# Patient Record
Sex: Male | Born: 1980 | Race: Black or African American | Hispanic: No | Marital: Single | Smoking: Former smoker
Health system: Southern US, Community
[De-identification: ages and names within clinical notes are randomized; demographics above are authoritative.]

## PROBLEM LIST (undated history)

## (undated) DIAGNOSIS — Z789 Other specified health status: Secondary | ICD-10-CM

---
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
## 2006-08-24 ENCOUNTER — Emergency Department (HOSPITAL_COMMUNITY): Admission: EM | Admit: 2006-08-24 | Discharge: 2006-08-24 | Payer: Self-pay

## 2006-08-26 ENCOUNTER — Emergency Department (HOSPITAL_COMMUNITY): Admission: EM | Admit: 2006-08-26 | Discharge: 2006-08-26 | Payer: Self-pay | Admitting: Emergency Medicine

## 2006-08-28 ENCOUNTER — Emergency Department (HOSPITAL_COMMUNITY): Admission: EM | Admit: 2006-08-28 | Discharge: 2006-08-29 | Payer: Self-pay | Admitting: *Deleted

## 2007-04-08 ENCOUNTER — Emergency Department (HOSPITAL_COMMUNITY): Admission: EM | Admit: 2007-04-08 | Discharge: 2007-04-08 | Payer: Self-pay | Admitting: Emergency Medicine
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
## 2008-08-18 ENCOUNTER — Emergency Department (HOSPITAL_COMMUNITY): Admission: EM | Admit: 2008-08-18 | Discharge: 2008-08-18 | Payer: Self-pay | Admitting: Emergency Medicine

## 2008-08-23 ENCOUNTER — Emergency Department (HOSPITAL_COMMUNITY): Admission: EM | Admit: 2008-08-23 | Discharge: 2008-08-24 | Payer: Self-pay | Admitting: Emergency Medicine

## 2008-10-26 ENCOUNTER — Emergency Department (HOSPITAL_COMMUNITY): Admission: EM | Admit: 2008-10-26 | Discharge: 2008-10-27 | Payer: Self-pay | Admitting: Emergency Medicine

## 2009-05-16 ENCOUNTER — Emergency Department (HOSPITAL_COMMUNITY): Admission: EM | Admit: 2009-05-16 | Discharge: 2009-05-16 | Payer: Self-pay | Admitting: Emergency Medicine

## 2010-04-25 ENCOUNTER — Emergency Department (HOSPITAL_COMMUNITY)
Admission: EM | Admit: 2010-04-25 | Discharge: 2010-04-25 | Payer: Self-pay | Source: Home / Self Care | Admitting: Emergency Medicine

## 2010-08-27 ENCOUNTER — Emergency Department (HOSPITAL_COMMUNITY)
Admission: EM | Admit: 2010-08-27 | Discharge: 2010-08-28 | Disposition: A | Discharge status: Home / Self Care | Payer: Self-pay | Attending: Emergency Medicine | Admitting: Emergency Medicine

## 2010-08-27 ENCOUNTER — Emergency Department (HOSPITAL_COMMUNITY): Payer: Self-pay

## 2010-08-27 DIAGNOSIS — M25569 Pain in unspecified knee: Secondary | ICD-10-CM | POA: Insufficient documentation

## 2011-11-15 ENCOUNTER — Emergency Department (HOSPITAL_COMMUNITY)
Admission: EM | Admit: 2011-11-15 | Discharge: 2011-11-15 | Disposition: A | Discharge status: Home / Self Care | Payer: Self-pay | Attending: Emergency Medicine | Admitting: Emergency Medicine

## 2011-11-15 ENCOUNTER — Encounter (HOSPITAL_COMMUNITY): Payer: Self-pay | Admitting: Emergency Medicine

## 2011-11-15 DIAGNOSIS — M272 Inflammatory conditions of jaws: Secondary | ICD-10-CM | POA: Insufficient documentation
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
## 2013-08-17 ENCOUNTER — Encounter: Payer: Self-pay | Admitting: Medical

## 2014-05-04 ENCOUNTER — Emergency Department (HOSPITAL_COMMUNITY)
Admission: EM | Admit: 2014-05-04 | Discharge: 2014-05-04 | Disposition: A | Discharge status: Home / Self Care | Payer: 59 | Attending: Emergency Medicine | Admitting: Emergency Medicine

## 2014-05-04 ENCOUNTER — Encounter (HOSPITAL_COMMUNITY): Payer: Self-pay | Admitting: Emergency Medicine

## 2014-05-04 DIAGNOSIS — Z720 Tobacco use: Secondary | ICD-10-CM | POA: Diagnosis not present

## 2014-05-04 DIAGNOSIS — K088 Other specified disorders of teeth and supporting structures: Secondary | ICD-10-CM | POA: Insufficient documentation

## 2014-05-04 DIAGNOSIS — K0889 Other specified disorders of teeth and supporting structures: Secondary | ICD-10-CM

## 2014-05-04 MED ORDER — PENICILLIN V POTASSIUM 500 MG PO TABS: 500.0000 mg | ORAL_TABLET | Freq: Three times a day (TID) | ORAL | Status: DC

## 2014-05-04 MED ORDER — HYDROCODONE-ACETAMINOPHEN 5-325 MG PO TABS: 1.0000 | ORAL_TABLET | ORAL | Status: DC | PRN

## 2014-05-04 MED ORDER — HYDROCODONE-ACETAMINOPHEN 5-325 MG PO TABS
2.0000 | ORAL_TABLET | Freq: Once | ORAL | Status: AC
  Administered 2014-05-04: 2 via ORAL
  Filled 2014-05-04: qty 2

## 2014-05-04 MED ORDER — PENICILLIN V POTASSIUM 500 MG PO TABS
500.0000 mg | ORAL_TABLET | Freq: Once | ORAL | Status: AC
  Administered 2014-05-04: 500 mg via ORAL
  Filled 2014-05-04: qty 1

## 2014-05-04 NOTE — ED Notes (Signed)
Pt was advised to follow up with dentist.

## 2014-05-04 NOTE — Discharge Instructions (Signed)

## 2014-05-04 NOTE — ED Notes (Signed)
Pt states that he has had tooth pain x 3 days. Pt denies any trauma or injury to the site. Pt states he has not seen a dentist since 2005 and does not have a regular dentist.

## 2014-05-04 NOTE — ED Provider Notes (Signed)
CSN: [id number]     Arrival date & time 05/04/14  2054 History   None    Chief Complaint  Patient presents with  . Dental Pain   Patient is a 33 y.o. male presenting with tooth pain. The history is provided by the patient. No language interpreter was used.  Dental Pain Associated symptoms: no facial swelling and no fever    This chart was scribed for non-physician practitioner Trixie DredgeEmily West, PA-C,  working with No att. providers found, by Andrew Auaven Small, ED Scribe. This patient was seen in room WTR9/WTR9 and the patient's care was started at 9:30 PM.  Lynder Parentsroy M Cutrona is a 33 y.o. male who presents to the Emergency Department complaining of left dental pain that began 3 days ago. Pt denies broken tooth. He reports worsening pain with eating. He reports an upcoming appointment with dentist next week. Pt denies fever and facial swelling. Pt is a smoker. Pt is otherwise healthy.  History reviewed. No pertinent past medical history. History reviewed. No pertinent past surgical history. No family history on file. History  Substance Use Topics  . Smoking status: Current Every Day Smoker  . Smokeless tobacco: Not on file  . Alcohol Use: Yes    Review of Systems  Constitutional: Negative for fever.  HENT: Positive for dental problem. Negative for facial swelling and trouble swallowing.   Respiratory: Negative for shortness of breath.   Gastrointestinal: Negative for nausea.  Musculoskeletal: Negative for myalgias.    Allergies  Percocet  Home Medications   Prior to Admission medications   Medication Sig Start Date End Date Taking? Authorizing Provider  Aspirin-Salicylamide-Caffeine (BC HEADACHE POWDER PO) Take 1 each by mouth as needed (pain).   Yes Historical Provider, MD  ibuprofen (ADVIL,MOTRIN) 200 MG tablet Take 400 mg by mouth every 6 (six) hours as needed for moderate pain.   Yes Historical Provider, MD   BP 129/79 mmHg  Pulse 57  Temp(Src) 98.3 F (36.8 C) (Oral)  Resp 18  Ht 6'  (1.829 m)  Wt 185 lb (83.915 kg)  BMI 25.08 kg/m2  SpO2 99% Physical Exam  Constitutional: He is oriented to person, place, and time. He appears well-developed and well-nourished. No distress.  HENT:  Head: Normocephalic and atraumatic.  Good dentition. Multiple molar fillings. No visualized abscess. Tender to alveolar ridge adjacent to number 19  Eyes: Conjunctivae and EOM are normal.  Neck: Neck supple.  Cardiovascular: Normal rate.   Pulmonary/Chest: Effort normal.  Musculoskeletal: Normal range of motion.  Lymphadenopathy:    He has no cervical adenopathy.  Neurological: He is alert and oriented to person, place, and time.  Skin: Skin is warm and dry.  Psychiatric: He has a normal mood and affect. His behavior is normal.  Nursing note and vitals reviewed.   ED Course  Procedures (including critical care time) DIAGNOSTIC STUDIES: Oxygen Saturation is 99% on RA, normal by my interpretation.    COORDINATION OF CARE: 9:30 PM- Pt advised of plan for treatment and pt agrees.  Labs Review Labs Reviewed - No data to display  Imaging Review No results found.   EKG Interpretation None      MDM   Final diagnoses:  None    1. Dental pain  He has dental follow up in place. Will provide abx and pain control for uncomplicated dental pain.  I personally performed the services described in this documentation, which was scribed in my presence. The recorded information has been reviewed and is accurate.  Arnoldo HookerShari A Yoseph Haile, PA-C 05/04/14 2317  Arby BarretteMarcy Pfeiffer, MD 05/05/14 [phone]

## 2016-12-16 ENCOUNTER — Encounter (HOSPITAL_COMMUNITY): Payer: Self-pay | Admitting: Emergency Medicine

## 2016-12-16 ENCOUNTER — Emergency Department (HOSPITAL_COMMUNITY)
Admission: EM | Admit: 2016-12-16 | Discharge: 2016-12-16 | Disposition: A | Discharge status: Home / Self Care | Payer: Self-pay | Attending: Emergency Medicine | Admitting: Emergency Medicine

## 2016-12-16 ENCOUNTER — Encounter (HOSPITAL_COMMUNITY): Payer: Self-pay

## 2016-12-16 ENCOUNTER — Encounter (HOSPITAL_COMMUNITY): Payer: Self-pay | Admitting: *Deleted

## 2016-12-16 ENCOUNTER — Emergency Department (HOSPITAL_COMMUNITY): Payer: Self-pay

## 2016-12-16 ENCOUNTER — Inpatient Hospital Stay (HOSPITAL_COMMUNITY)
Admission: EM | Admit: 2016-12-16 | Discharge: 2016-12-21 | DRG: 076 | Discharge status: Against Medical Advice | Payer: Self-pay | Attending: Internal Medicine | Admitting: Internal Medicine

## 2016-12-16 ENCOUNTER — Ambulatory Visit (HOSPITAL_COMMUNITY)
Admission: EM | Admit: 2016-12-16 | Discharge: 2016-12-16 | Disposition: A | Discharge status: Other Acute Inpatient Hospital | Payer: Self-pay

## 2016-12-16 DIAGNOSIS — R519 Headache, unspecified: Secondary | ICD-10-CM

## 2016-12-16 DIAGNOSIS — R291 Meningismus: Secondary | ICD-10-CM

## 2016-12-16 DIAGNOSIS — F17200 Nicotine dependence, unspecified, uncomplicated: Secondary | ICD-10-CM | POA: Diagnosis present

## 2016-12-16 DIAGNOSIS — B029 Zoster without complications: Secondary | ICD-10-CM

## 2016-12-16 DIAGNOSIS — Z5321 Procedure and treatment not carried out due to patient leaving prior to being seen by health care provider: Secondary | ICD-10-CM | POA: Insufficient documentation

## 2016-12-16 DIAGNOSIS — G039 Meningitis, unspecified: Secondary | ICD-10-CM

## 2016-12-16 DIAGNOSIS — B027 Disseminated zoster: Secondary | ICD-10-CM

## 2016-12-16 DIAGNOSIS — R1030 Lower abdominal pain, unspecified: Secondary | ICD-10-CM | POA: Insufficient documentation

## 2016-12-16 DIAGNOSIS — Z886 Allergy status to analgesic agent status: Secondary | ICD-10-CM

## 2016-12-16 DIAGNOSIS — R51 Headache: Secondary | ICD-10-CM

## 2016-12-16 DIAGNOSIS — B021 Zoster meningitis: Principal | ICD-10-CM | POA: Diagnosis present

## 2016-12-16 HISTORY — DX: Other specified health status: Z78.9

## 2016-12-16 LAB — CBC WITH DIFFERENTIAL/PLATELET
Basophils Absolute: 0 10*3/uL (ref 0.0–0.1)
Basophils Relative: 0 %
EOS ABS: 0 10*3/uL (ref 0.0–0.7)
EOS PCT: 0 %
HCT: 42.4 % (ref 39.0–52.0)
Hemoglobin: 14.7 g/dL (ref 13.0–17.0)
LYMPHS PCT: 8 %
Lymphs Abs: 0.9 10*3/uL (ref 0.7–4.0)
MCH: 29.4 pg (ref 26.0–34.0)
MCHC: 34.7 g/dL (ref 30.0–36.0)
MCV: 84.8 fL (ref 78.0–100.0)
Monocytes Absolute: 0.5 10*3/uL (ref 0.1–1.0)
Monocytes Relative: 5 %
NEUTROS ABS: 9.9 10*3/uL — AB (ref 1.7–7.7)
NEUTROS PCT: 87 %
PLATELETS: 192 10*3/uL (ref 150–400)
RBC: 5 MIL/uL (ref 4.22–5.81)
RDW: 12.3 % (ref 11.5–15.5)
WBC: 11.2 10*3/uL — ABNORMAL HIGH (ref 4.0–10.5)

## 2016-12-16 LAB — CSF CELL COUNT WITH DIFFERENTIAL
Eosinophils, CSF: 0 % (ref 0–1)
Lymphs, CSF: 98 % — ABNORMAL HIGH (ref 40–80)
MONOCYTE-MACROPHAGE-SPINAL FLUID: 2 % — AB (ref 15–45)
OTHER CELLS CSF: 0
RBC Count, CSF: 1 /mm3 — ABNORMAL HIGH
Segmented Neutrophils-CSF: 0 % (ref 0–6)
TUBE #: 3
WBC, CSF: 605 /mm3 (ref 0–5)

## 2016-12-16 LAB — COMPREHENSIVE METABOLIC PANEL
ALT: 16 U/L — ABNORMAL LOW (ref 17–63)
ANION GAP: 6 (ref 5–15)
AST: 19 U/L (ref 15–41)
Albumin: 3.7 g/dL (ref 3.5–5.0)
Alkaline Phosphatase: 34 U/L — ABNORMAL LOW (ref 38–126)
BUN: 6 mg/dL (ref 6–20)
CHLORIDE: 105 mmol/L (ref 101–111)
CO2: 27 mmol/L (ref 22–32)
Calcium: 8.8 mg/dL — ABNORMAL LOW (ref 8.9–10.3)
Creatinine, Ser: 1.18 mg/dL (ref 0.61–1.24)
GFR calc non Af Amer: >60 mL/min (ref >60)
Glucose, Bld: 100 mg/dL — ABNORMAL HIGH (ref 65–99)
POTASSIUM: 4 mmol/L (ref 3.5–5.1)
SODIUM: 138 mmol/L (ref 135–145)
Total Bilirubin: 1 mg/dL (ref 0.3–1.2)
Total Protein: 5.8 g/dL — ABNORMAL LOW (ref 6.5–8.1)

## 2016-12-16 LAB — PROTEIN, CSF: TOTAL PROTEIN, CSF: 92 mg/dL — AB (ref 15–45)

## 2016-12-16 LAB — GLUCOSE, CSF: Glucose, CSF: 52 mg/dL (ref 40–70)

## 2016-12-16 LAB — LIPASE, BLOOD: LIPASE: 23 U/L (ref 11–51)

## 2016-12-16 MED ORDER — ONDANSETRON HCL 4 MG PO TABS
4.0000 mg | ORAL_TABLET | Freq: Four times a day (QID) | ORAL | Status: DC | PRN
  Administered 2016-12-19: 4 mg via ORAL
  Filled 2016-12-16: qty 1

## 2016-12-16 MED ORDER — SODIUM CHLORIDE 0.9 % IV SOLN
INTRAVENOUS | Status: AC
  Administered 2016-12-16: 100 mL/h via INTRAVENOUS

## 2016-12-16 MED ORDER — DEXTROSE 5 % IV SOLN
900.0000 mg | Freq: Three times a day (TID) | INTRAVENOUS | Status: DC
  Administered 2016-12-17 – 2016-12-21 (×14): 900 mg via INTRAVENOUS
  Filled 2016-12-16 (×17): qty 18

## 2016-12-16 MED ORDER — SENNOSIDES-DOCUSATE SODIUM 8.6-50 MG PO TABS
1.0000 | ORAL_TABLET | Freq: Every evening | ORAL | Status: DC | PRN
  Filled 2016-12-16: qty 1

## 2016-12-16 MED ORDER — ACETAMINOPHEN 650 MG RE SUPP: 650.0000 mg | Freq: Four times a day (QID) | RECTAL | Status: DC | PRN

## 2016-12-16 MED ORDER — ACETAMINOPHEN 325 MG PO TABS
650.0000 mg | ORAL_TABLET | Freq: Four times a day (QID) | ORAL | Status: DC | PRN
  Administered 2016-12-17: 650 mg via ORAL
  Filled 2016-12-16: qty 2

## 2016-12-16 MED ORDER — DEXTROSE 5 % IV SOLN
2.0000 g | Freq: Two times a day (BID) | INTRAVENOUS | Status: DC
  Administered 2016-12-17 (×2): 2 g via INTRAVENOUS
  Filled 2016-12-16 (×3): qty 2

## 2016-12-16 MED ORDER — VANCOMYCIN HCL IN DEXTROSE 1-5 GM/200ML-% IV SOLN
1000.0000 mg | Freq: Once | INTRAVENOUS | Status: AC
  Administered 2016-12-16: 1000 mg via INTRAVENOUS
  Filled 2016-12-16: qty 200

## 2016-12-16 MED ORDER — HYDROCODONE-ACETAMINOPHEN 5-325 MG PO TABS
1.0000 | ORAL_TABLET | ORAL | Status: DC | PRN
  Administered 2016-12-17 (×3): 1 via ORAL
  Administered 2016-12-17 – 2016-12-18 (×5): 2 via ORAL
  Administered 2016-12-19: 1 via ORAL
  Filled 2016-12-16: qty 2
  Filled 2016-12-16 (×2): qty 1
  Filled 2016-12-16 (×4): qty 2
  Filled 2016-12-16 (×2): qty 1

## 2016-12-16 MED ORDER — PROCHLORPERAZINE EDISYLATE 5 MG/ML IJ SOLN
10.0000 mg | Freq: Once | INTRAMUSCULAR | Status: AC
  Administered 2016-12-16: 10 mg via INTRAVENOUS
  Filled 2016-12-16: qty 2

## 2016-12-16 MED ORDER — BISACODYL 5 MG PO TBEC
5.0000 mg | DELAYED_RELEASE_TABLET | Freq: Every day | ORAL | Status: DC | PRN
  Administered 2016-12-21: 5 mg via ORAL
  Filled 2016-12-16 (×3): qty 1

## 2016-12-16 MED ORDER — DEXAMETHASONE SODIUM PHOSPHATE 10 MG/ML IJ SOLN
10.0000 mg | Freq: Once | INTRAMUSCULAR | Status: AC
  Administered 2016-12-16: 10 mg via INTRAVENOUS
  Filled 2016-12-16: qty 1

## 2016-12-16 MED ORDER — ONDANSETRON HCL 4 MG/2ML IJ SOLN
4.0000 mg | Freq: Four times a day (QID) | INTRAMUSCULAR | Status: DC | PRN
  Administered 2016-12-17 – 2016-12-19 (×5): 4 mg via INTRAVENOUS
  Filled 2016-12-16 (×5): qty 2

## 2016-12-16 MED ORDER — DIPHENHYDRAMINE HCL 50 MG/ML IJ SOLN
25.0000 mg | Freq: Once | INTRAMUSCULAR | Status: AC
  Administered 2016-12-16: 25 mg via INTRAVENOUS
  Filled 2016-12-16: qty 1

## 2016-12-16 MED ORDER — HYDROMORPHONE HCL 2 MG PO TABS
2.0000 mg | ORAL_TABLET | ORAL | Status: DC | PRN
  Administered 2016-12-17: 2 mg via ORAL
  Filled 2016-12-16: qty 1

## 2016-12-16 MED ORDER — ACYCLOVIR SODIUM 50 MG/ML IV SOLN
10.0000 mg/kg | Freq: Once | INTRAVENOUS | Status: AC
  Administered 2016-12-16: 905 mg via INTRAVENOUS
  Filled 2016-12-16: qty 18.1

## 2016-12-16 MED ORDER — VANCOMYCIN HCL IN DEXTROSE 750-5 MG/150ML-% IV SOLN
750.0000 mg | Freq: Once | INTRAVENOUS | Status: AC
  Administered 2016-12-17: 750 mg via INTRAVENOUS
  Filled 2016-12-16 (×2): qty 150

## 2016-12-16 MED ORDER — VANCOMYCIN HCL IN DEXTROSE 1-5 GM/200ML-% IV SOLN
1000.0000 mg | Freq: Three times a day (TID) | INTRAVENOUS | Status: DC
  Administered 2016-12-17 – 2016-12-18 (×4): 1000 mg via INTRAVENOUS
  Filled 2016-12-16 (×5): qty 200

## 2016-12-16 MED ORDER — DEXTROSE 5 % IV SOLN: 2.0000 g | Freq: Two times a day (BID) | INTRAVENOUS | Status: DC

## 2016-12-16 MED ORDER — SODIUM CHLORIDE 0.9 % IV SOLN
Freq: Once | INTRAVENOUS | Status: AC
  Administered 2016-12-16: 2000 mL via INTRAVENOUS

## 2016-12-16 MED ORDER — HYDROCODONE-ACETAMINOPHEN 5-325 MG PO TABS: 1.0000 | ORAL_TABLET | ORAL | Status: DC | PRN

## 2016-12-16 MED ORDER — DEXTROSE 5 % IV SOLN
2.0000 g | Freq: Once | INTRAVENOUS | Status: AC
  Administered 2016-12-16: 2 g via INTRAVENOUS
  Filled 2016-12-16: qty 2

## 2016-12-16 MED ORDER — LIDOCAINE HCL (PF) 1 % IJ SOLN
INTRAMUSCULAR | Status: AC
  Administered 2016-12-16: 5 mL via INTRASPINAL
  Filled 2016-12-16: qty 20

## 2016-12-16 NOTE — ED Notes (Signed)
Dr. Rubin PayorPickering notified on pt.'s elevated spinal fluid white blood count .

## 2016-12-16 NOTE — ED Notes (Signed)
Pt to flouro
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
## 2016-12-16 NOTE — ED Provider Notes (Signed)
CSN: [id number]     Arrival date & time 12/16/16  1300 History   None    Chief Complaint  Patient presents with  . Headache   (Consider location/radiation/quality/duration/timing/severity/associated sxs/prior Treatment) Patient c/o lower abdominal pain for 2 days.  He is c/o severe headache and neck pain wihich is worsening.  He is c/o photophobia.   The history is provided by the patient and the spouse.  Headache  Pain location:  Generalized Quality:  Dull Radiates to:  L neck and R neck Severity currently:  10/10 Severity at highest:  2/10 Onset quality:  Sudden Duration:  2 days Timing:  Constant Progression:  Worsening Chronicity:  New Similar to prior headaches: no   Context: activity and bright light   Relieved by:  Nothing Worsened by:  Activity, light and neck movement Ineffective treatments:  None tried Associated symptoms: back pain, fatigue and neck stiffness     History reviewed. No pertinent past medical history. History reviewed. No pertinent surgical history. History reviewed. No pertinent family history. Social History  Substance Use Topics  . Smoking status: Current Every Day Smoker  . Smokeless tobacco: Never Used  . Alcohol use Yes    Review of Systems  Constitutional: Positive for fatigue.  Eyes: Negative.   Respiratory: Negative.   Cardiovascular: Negative.   Gastrointestinal: Negative.   Endocrine: Negative.   Genitourinary: Negative.   Musculoskeletal: Positive for back pain and neck stiffness.  Neurological: Positive for headaches.  Hematological: Negative.     Allergies  Percocet [oxycodone-acetaminophen]  Home Medications   Prior to Admission medications   Medication Sig Start Date End Date Taking? Authorizing Provider  Aspirin-Salicylamide-Caffeine (BC HEADACHE POWDER PO) Take 1 each by mouth as needed (pain).    [provider]  HYDROcodone-acetaminophen (NORCO/VICODIN) 5-325 MG per tablet Take 1-2 tablets by mouth  every 4 (four) hours as needed. 05/04/14   Elpidio AnisUpstill, Shari, PA-C  ibuprofen (ADVIL,MOTRIN) 200 MG tablet Take 400 mg by mouth every 6 (six) hours as needed for moderate pain.    [provider]  penicillin v potassium (VEETID) 500 MG tablet Take 1 tablet (500 mg total) by mouth 3 (three) times daily. 05/04/14   Elpidio AnisUpstill, Shari, PA-C   Meds Ordered and Administered this Visit  Medications - No data to display  BP 132/74 (BP Location: Right Arm)   Pulse 82   Temp 98.6 F (37 C) (Oral)   Resp 18   SpO2 100%  No data found.   Physical Exam  Constitutional: He is oriented to person, place, and time. He appears well-developed and well-nourished.  HENT:  Head: Normocephalic and atraumatic.  Eyes: Pupils are equal, round, and reactive to light. Conjunctivae and EOM are normal.  Neck:  Patient with nuchal rigidity and neck pain.  Cardiovascular: Normal rate, regular rhythm and normal heart sounds.   Pulmonary/Chest: Effort normal and breath sounds normal.  Neurological: He is alert and oriented to person, place, and time.  Nursing note and vitals reviewed.   Urgent Care Course     Procedures (including critical care time)  Labs Review Labs Reviewed - No data to display  Imaging Review Ct Head Wo Contrast  Result Date: 12/16/2016 CLINICAL DATA:  Headache.  Concern for subarachnoid hemorrhage. EXAM: CT HEAD WITHOUT CONTRAST TECHNIQUE: Contiguous axial images were obtained from the base of the skull through the vertex without intravenous contrast. COMPARISON:  None. FINDINGS: Brain: No acute intracranial abnormality. Specifically, no hemorrhage, hydrocephalus, mass lesion, acute infarction, or significant  intracranial injury. Vascular: No hyperdense vessel or unexpected calcification. Skull: No acute calvarial abnormality. Sinuses/Orbits: Rounded soft tissue densities noted in both maxillary sinuses. Remainder the paranasal sinuses are clear. Mastoid air cells clear. No orbital  scratched set orbital soft tissues unremarkable. Other: None IMPRESSION: No acute intracranial abnormality. Mucous retention cysts or polyps in the maxillary sinuses bilaterally. Electronically Signed   By: Charlett Nose M.D.   On: 12/16/2016 16:42     Visual Acuity Review  Right Eye Distance:   Left Eye Distance:   Bilateral Distance:    Right Eye Near:   Left Eye Near:    Bilateral Near:         MDM   1. Intractable episodic headache, unspecified headache type   2. Nuchal rigidity    Transfer to ED via EMS      Deatra Canter, [place] 12/16/16 [phone]

## 2016-12-16 NOTE — Progress Notes (Signed)
Pharmacy Antibiotic Note Kenneth Schaefer is a 36 y.o. male admitted on 12/16/2016 with concern for meningitis. Pharmacy has been consulted for ceftriaxone, acyclovir, and vancomycin dosing.  Plan: 1. Vancomycin 1000 IV every 8 hours.  Goal trough 15-20 mcg/mL.  2. Ceftriaxone 2 grams IV every 12 hours. 3. Acyclovir 900 mg (10 mg/kg) IV every 8 hours.    Height: 6' (182.9 cm) Weight: 200 lb (90.7 kg) IBW/kg (Calculated) : 77.6  Temp (24hrs), Avg:98.5 F (36.9 C), Min:98.2 F (36.8 C), Max:98.6 F (37 C)   Recent Labs Lab 12/16/16 1619  WBC 11.2*  CREATININE 1.18    Estimated Creatinine Clearance: 95.9 mL/min (by C-G formula based on SCr of 1.18 mg/dL).    Allergies  Allergen Reactions  . Percocet [Oxycodone-Acetaminophen] Other (See Comments)    Makes him violent     Thank you for allowing pharmacy to be a part of this patient's care.  Pollyann SamplesAndy Gessica Schaefer, PharmD, BCPS 12/16/2016, 10:23 PM

## 2016-12-16 NOTE — ED Triage Notes (Addendum)
Pt       Reports  A  Headache    X   sev  Days   [facility]  And  [facility]  [facility]     Pt  Reports       Pain  Is   Worse   When  He  Bends  His  Neck     He  Reports  Photophobia   As   Well    ptin a  Private  Room     And   Mask  Was  Applied      Pt    Donated  Plasma   sev  Days  Ago  As   Well   Never         Has  Never  Had    A  Headache  Like  This  Before

## 2016-12-16 NOTE — ED Notes (Signed)
Care  Link  Called  No  Unit  Available     

## 2016-12-16 NOTE — H&P (Signed)
History and Physical    Kenneth Schaefer ZOX:[id number] DOB: 02-Jun-1980 DOA: 12/16/2016  PCP: Default, Provider, MD   Patient coming from: Home, by way of Urgent Care  Chief Complaint: Headache, photophobia, nausea, neck stiffness   HPI: Kenneth Schaefer is a 36 y.o. male who denies any significant past medical history, now presenting with headache, photophobia, nausea with vomiting, and neck stiffness. He reports being in his usual state of health until approximately 3 days ago when he noted the insidious development of lower abdominal discomfort with nausea and nonbloody nonbilious vomiting. There was no abdominal pain or diarrhea that time and he was not experiencing fevers or chills. Over the ensuing days, he has developed a headache with photophobia. He was seen at the [facility] emergency department where he had reportedly denied any neck stiffness, but left AMA. He then presented to urgent care center for the same complaints, but reported neck stiffness at that time and was directed to the ED for further evaluation. Patient denies any recent sore throat, ear pain, rhinorrhea, or sinus congestion. Denies any sick contacts or long distance travel. He has never experienced similar symptoms previously.    ED Course: Upon arrival to the ED, patient is found to be afebrile, saturating adequately on room air, with vitals otherwise stable. Chemistry panel is unremarkable and CBC features a mild leukocytosis to 11,200. Lumbar puncture was performed under fluoroscopy and notable for elevated WBCs with monocytic predominance. Gram stain on the CSF was performed and no organisms were identified. CSF was sent for culture, HSV, and VDRL. Infectious disease was consulted by the ED physician, suspects a viral etiology, but recommends treating for a bacterial meningitis empirically pending the culture results. Patient was treated with Decadron, vancomycin, 2 g IV Rocephin, and started on acyclovir. He was provided with  Benadryl and antiemetics for supportive care. He remained hemodynamically stable in the ED, in no apparent respiratory distress, and will be admitted to the medical surgical unit for ongoing evaluation and management of headache with photophobia and neck stiffness concerning for meningitis.  Review of Systems:  All other systems reviewed and apart from HPI, are negative.  History reviewed. No pertinent past medical history.  History reviewed. No pertinent surgical history.   reports that he has been smoking.  He has never used smokeless tobacco. He reports that he drinks alcohol. He reports that he does not use drugs.  Allergies  Allergen Reactions  . Percocet [Oxycodone-Acetaminophen] Other (See Comments)    Makes him violent    History reviewed. No pertinent family history.   Prior to Admission medications   Medication Sig Start Date End Date Taking? Authorizing Provider  Aspirin-Salicylamide-Caffeine (BC HEADACHE POWDER PO) Take 1 each by mouth as needed (pain).    [provider]  HYDROcodone-acetaminophen (NORCO/VICODIN) 5-325 MG per tablet Take 1-2 tablets by mouth every 4 (four) hours as needed. 05/04/14   Elpidio Anis, PA-C  ibuprofen (ADVIL,MOTRIN) 200 MG tablet Take 400 mg by mouth every 6 (six) hours as needed for moderate pain.    [provider]    Physical Exam: Vitals:   12/16/16 1815 12/16/16 1830 12/16/16 1845 12/16/16 1900  BP:  128/71 122/71 127/73  Pulse: 78 76 74 73  Resp: (!) 21 20 19 20   Temp:      TempSrc:      SpO2: 97% 97% 96% 92%  Weight:      Height:          Constitutional: No  respiratory distress, calm, appears uncomfortable.  Eyes: PERTLA, lids and conjunctivae normal ENMT: Mucous membranes are moist. Posterior pharynx clear of any exudate or lesions.   Neck: normal, supple, no masses, no thyromegaly Respiratory: clear to auscultation bilaterally, no wheezing, no crackles. Normal respiratory effort.   Cardiovascular: S1  & S2 heard, regular rate and rhythm. No extremity edema. No significant JVD. Abdomen: No distension, no tenderness, no masses palpated. Bowel sounds active.  Musculoskeletal: no clubbing / cyanosis. No joint deformity upper and lower extremities. Normal muscle tone.  Skin: no significant rashes, lesions, ulcers. Warm, dry, well-perfused. Neurologic: CN 2-12 grossly intact. Sensation intact, DTR normal. Strength 5/5 in all 4 limbs.  Psychiatric: Alert and oriented x 3. Calm and cooperative.     Labs on Admission: I have personally reviewed following labs and imaging studies  CBC:  Recent Labs Lab 12/16/16 1619  WBC 11.2*  NEUTROABS 9.9*  HGB 14.7  HCT 42.4  MCV 84.8  PLT 192   Basic Metabolic Panel:  Recent Labs Lab 12/16/16 1619  NA 138  K 4.0  CL 105  CO2 27  GLUCOSE 100*  BUN 6  CREATININE 1.18  CALCIUM 8.8*   GFR: Estimated Creatinine Clearance: 95.9 mL/min (by C-G formula based on SCr of 1.18 mg/dL). Liver Function Tests:  Recent Labs Lab 12/16/16 1619  AST 19  ALT 16*  ALKPHOS 34*  BILITOT 1.0  PROT 5.8*  ALBUMIN 3.7    Recent Labs Lab 12/16/16 1619  LIPASE 23   No results for input(s): AMMONIA in the last 168 hours. Coagulation Profile: No results for input(s): INR, PROTIME in the last 168 hours. Cardiac Enzymes: No results for input(s): CKTOTAL, CKMB, CKMBINDEX, TROPONINI in the last 168 hours. BNP (last 3 results) No results for input(s): PROBNP in the last 8760 hours. HbA1C: No results for input(s): HGBA1C in the last 72 hours. CBG: No results for input(s): GLUCAP in the last 168 hours. Lipid Profile: No results for input(s): CHOL, HDL, LDLCALC, TRIG, CHOLHDL, LDLDIRECT in the last 72 hours. Thyroid Function Tests: No results for input(s): TSH, T4TOTAL, FREET4, T3FREE, THYROIDAB in the last 72 hours. Anemia Panel: No results for input(s): VITAMINB12, FOLATE, FERRITIN, TIBC, IRON, RETICCTPCT in the last 72 hours. Urine analysis: No  results found for: COLORURINE, APPEARANCEUR, LABSPEC, PHURINE, GLUCOSEU, HGBUR, BILIRUBINUR, KETONESUR, PROTEINUR, UROBILINOGEN, NITRITE, LEUKOCYTESUR Sepsis Labs: @LABRCNTIP (procalcitonin:4,lacticidven:4) ) Recent Results (from the past 240 hour(s))  CSF culture     Status: None (Preliminary result)   Collection Time: 12/16/16  6:06 PM  Result Value Ref Range Status   Specimen Description CSF  Final   Special Requests NONE  Final   Gram Stain   Final    WBC PRESENT, PREDOMINANTLY MONONUCLEAR NO ORGANISMS SEEN CYTOSPIN SMEAR    Culture PENDING  Incomplete   Report Status PENDING  Incomplete     Radiological Exams on Admission: Ct Head Wo Contrast  Result Date: 12/16/2016 CLINICAL DATA:  Headache.  Concern for subarachnoid hemorrhage. EXAM: CT HEAD WITHOUT CONTRAST TECHNIQUE: Contiguous axial images were obtained from the base of the skull through the vertex without intravenous contrast. COMPARISON:  None. FINDINGS: Brain: No acute intracranial abnormality. Specifically, no hemorrhage, hydrocephalus, mass lesion, acute infarction, or significant intracranial injury. Vascular: No hyperdense vessel or unexpected calcification. Skull: No acute calvarial abnormality. Sinuses/Orbits: Rounded soft tissue densities noted in both maxillary sinuses. Remainder the paranasal sinuses are clear. Mastoid air cells clear. No orbital scratched set orbital soft tissues unremarkable. Other: None IMPRESSION: No  acute intracranial abnormality. Mucous retention cysts or polyps in the maxillary sinuses bilaterally. Electronically Signed   By: Charlett Nose M.D.   On: 12/16/2016 16:42   Dg Lumbar Puncture Fluoro Guide  Result Date: 12/16/2016 CLINICAL DATA:  Severe headache EXAM: DIAGNOSTIC LUMBAR PUNCTURE UNDER FLUOROSCOPIC GUIDANCE FLUOROSCOPY TIME:  Fluoroscopy Time:  18 seconds Radiation Exposure Index (if provided by the fluoroscopic device): Number of Acquired Spot Images: 0 PROCEDURE: Informed consent was  obtained from the patient prior to the procedure, including potential complications of headache, allergy, and pain. With the patient prone, the lower back was prepped with Betadine. 1% Lidocaine was used for local anesthesia. Lumbar puncture was performed at the L4-5 level using a 20 gauge needle with return of clear CSF with an opening pressure of 23 cm water. Six ml of CSF were obtained for laboratory studies. The patient tolerated the procedure well and there were no apparent complications. IMPRESSION: Technically successful lumbar puncture under fluoroscopic guidance. Electronically Signed   By: Charlett Nose M.D.   On: 12/16/2016 18:03    EKG: Not performed.  Assessment/Plan  1. Meningitis  - Pt presents with new headache, photophobia, neck stiffness, and vomiting  - Mild leukocytosis noted and LP performed with CSF notable for elevated WBC's, no organism on gram-stain  - ID is consulting and much appreciated - Likely a viral etiology; consultant advises treating with abx and acyclovir pending CSF cultures and HSV assay  - Continue abx, antiviral, supportive care with IVF hydration, antiemetics, and analgesia  - Maintain droplet precautions pending cultures     DVT prophylaxis: SCD's Code Status: Full  Family Communication: Wife and mother updated at bedside with patient's permission Disposition Plan: Admit to med-surg unit Consults called: Infectious disease Admission status: Inpatient    Briscoe Deutscher, MD [facility] Pager [phone]  If 7PM-7AM, please contact night-coverage [email or web address] Password [facility]  12/16/2016, 8:51 PM

## 2016-12-16 NOTE — ED Notes (Signed)
Report   Phoned   To    Charge  Nurse

## 2016-12-16 NOTE — ED Triage Notes (Signed)
Pt sent from Urgent care with c/o headache and neck p;ain and stiffness after giving plasma 3 days ago.

## 2016-12-16 NOTE — Discharge Instructions (Signed)
Go to ED

## 2016-12-16 NOTE — ED Triage Notes (Addendum)
Pt c/o lower abdominal pain onset 2 days ago after donating plasma, sudden onset headache, photophobia, and emesis onset yesterday. Pain worse with coughing and emesis. No history of similar headache. No nuchal rigidity.

## 2016-12-16 NOTE — ED Notes (Signed)
Report attempted, RN to call back. 

## 2016-12-17 DIAGNOSIS — G039 Meningitis, unspecified: Secondary | ICD-10-CM

## 2016-12-17 LAB — CBC WITH DIFFERENTIAL/PLATELET
BASOS ABS: 0 10*3/uL (ref 0.0–0.1)
Basophils Relative: 0 %
EOS ABS: 0 10*3/uL (ref 0.0–0.7)
EOS PCT: 0 %
HCT: 40.5 % (ref 39.0–52.0)
HEMOGLOBIN: 14.2 g/dL (ref 13.0–17.0)
LYMPHS PCT: 8 %
Lymphs Abs: 1.3 10*3/uL (ref 0.7–4.0)
MCH: 29.8 pg (ref 26.0–34.0)
MCHC: 35.1 g/dL (ref 30.0–36.0)
MCV: 84.9 fL (ref 78.0–100.0)
Monocytes Absolute: 1 10*3/uL (ref 0.1–1.0)
Monocytes Relative: 7 %
NEUTROS PCT: 85 %
Neutro Abs: 12.7 10*3/uL — ABNORMAL HIGH (ref 1.7–7.7)
PLATELETS: 194 10*3/uL (ref 150–400)
RBC: 4.77 MIL/uL (ref 4.22–5.81)
RDW: 12.2 % (ref 11.5–15.5)
WBC: 15 10*3/uL — AB (ref 4.0–10.5)

## 2016-12-17 LAB — BASIC METABOLIC PANEL
Anion gap: 5 (ref 5–15)
BUN: 8 mg/dL (ref 6–20)
CHLORIDE: 104 mmol/L (ref 101–111)
CO2: 27 mmol/L (ref 22–32)
Calcium: 8.8 mg/dL — ABNORMAL LOW (ref 8.9–10.3)
Creatinine, Ser: 1.12 mg/dL (ref 0.61–1.24)
Glucose, Bld: 100 mg/dL — ABNORMAL HIGH (ref 65–99)
POTASSIUM: 3.6 mmol/L (ref 3.5–5.1)
SODIUM: 136 mmol/L (ref 135–145)

## 2016-12-17 LAB — HIV ANTIBODY (ROUTINE TESTING W REFLEX): HIV SCREEN 4TH GENERATION: NONREACTIVE

## 2016-12-17 LAB — PATHOLOGIST SMEAR REVIEW: Path Review: INCREASED

## 2016-12-17 MED ORDER — KETOROLAC TROMETHAMINE 30 MG/ML IJ SOLN
15.0000 mg | Freq: Once | INTRAMUSCULAR | Status: AC
  Administered 2016-12-17: 15 mg via INTRAVENOUS
  Filled 2016-12-17: qty 1

## 2016-12-17 MED ORDER — METOCLOPRAMIDE HCL 5 MG/ML IJ SOLN
10.0000 mg | Freq: Once | INTRAMUSCULAR | Status: AC
  Administered 2016-12-17: 10 mg via INTRAVENOUS
  Filled 2016-12-17: qty 2

## 2016-12-17 MED ORDER — DIPHENHYDRAMINE HCL 25 MG PO CAPS
25.0000 mg | ORAL_CAPSULE | ORAL | Status: DC | PRN
  Administered 2016-12-17 (×3): 25 mg via ORAL
  Filled 2016-12-17 (×3): qty 1

## 2016-12-17 MED ORDER — DIPHENHYDRAMINE HCL 50 MG/ML IJ SOLN
25.0000 mg | Freq: Once | INTRAMUSCULAR | Status: AC
  Administered 2016-12-17: 25 mg via INTRAVENOUS
  Filled 2016-12-17: qty 1

## 2016-12-17 NOTE — Progress Notes (Signed)
PROGRESS NOTE    Kenneth Schaefer  [id number] DOB: [birth year] DOA: 12/16/2016 PCP: Default, Provider, MD    Brief Narrative: Kenneth Parentsroy M Hector is a 36 y.o. male who denies any significant past medical history, now presenting with headache, photophobia, nausea with vomiting, and neck stiffness. He was admitted for evaluation of meningitis Assessment & Plan:   Principal Problem:   Meningitis Active Problems:   Headache   Nausea, vomiting , headache and mild neck stiffness:  Pt reports all the symptoms improved except for headache, . LP performed and CSF fluid sent for analysis. WBC count is 605, total protein is 92, ,gram stain is negative and cultures are pending. Pt currently on vancomycin, rocpehin and acyclovir.  ID consulted by admitting physician.  Pain control and await results.  CT head without contrast negative.     DVT prophylaxis: (scd's Code Status: (Full Family Communication: (none at bedside, discussed the plan with the patient.  Disposition Plan: pending culture report.    Consultants:   LP  Procedures: (LP Antimicrobials: vancomycin , rocephin and acyclovir.   Subjective: Just headache, no nausea, vomiting or neck stiffness.   Objective: Vitals:   12/16/16 2145 12/16/16 2314 12/17/16 0622 12/17/16 1527  BP: (!) 97/47 116/65 105/86 (!) 123/58  Pulse: 78 79 70 70  Resp: 20 18 18 18   Temp:  98.7 F (37.1 C) 99 F (37.2 C) 98.9 F (37.2 C)  TempSrc:  Oral Oral Oral  SpO2: 90% 99% 100% 100%  Weight:      Height:        Intake/Output Summary (Last 24 hours) at 12/17/16 1656 Last data filed at 12/17/16 1527  Gross per 24 hour  Intake              730 ml  Output                0 ml  Net              730 ml   Filed Weights   12/16/16 1459  Weight: 90.7 kg (200 lb)    Examination:  General exam: Appears calm and comfortable  Respiratory system: Clear to auscultation. Respiratory effort normal. Cardiovascular system: S1 & S2 heard, RRR. No JVD,  murmurs, rubs, gallops or clicks. No pedal edema. Gastrointestinal system: Abdomen is nondistended, soft and nontender. No organomegaly or masses felt. Normal bowel sounds heard. Central nervous system: Alert and oriented. No focal neurological deficits. Extremities: Symmetric 5 x 5 power. Skin: No rashes, lesions or ulcers Psychiatry: Judgement and insight appear normal. Mood & affect appropriate.     Data Reviewed: I have personally reviewed following labs and imaging studies  CBC:  Recent Labs Lab 12/16/16 1619 12/17/16 0520  WBC 11.2* 15.0*  NEUTROABS 9.9* 12.7*  HGB 14.7 14.2  HCT 42.4 40.5  MCV 84.8 84.9  PLT 192 194   Basic Metabolic Panel:  Recent Labs Lab 12/16/16 1619 12/17/16 0520  NA 138 136  K 4.0 3.6  CL 105 104  CO2 27 27  GLUCOSE 100* 100*  BUN 6 8  CREATININE 1.18 1.12  CALCIUM 8.8* 8.8*   GFR: Estimated Creatinine Clearance: 101 mL/min (by C-G formula based on SCr of 1.12 mg/dL). Liver Function Tests:  Recent Labs Lab 12/16/16 1619  AST 19  ALT 16*  ALKPHOS 34*  BILITOT 1.0  PROT 5.8*  ALBUMIN 3.7    Recent Labs Lab 12/16/16 1619  LIPASE 23   No results for input(s): AMMONIA  in the last 168 hours. Coagulation Profile: No results for input(s): INR, PROTIME in the last 168 hours. Cardiac Enzymes: No results for input(s): CKTOTAL, CKMB, CKMBINDEX, TROPONINI in the last 168 hours. BNP (last 3 results) No results for input(s): PROBNP in the last 8760 hours. HbA1C: No results for input(s): HGBA1C in the last 72 hours. CBG: No results for input(s): GLUCAP in the last 168 hours. Lipid Profile: No results for input(s): CHOL, HDL, LDLCALC, TRIG, CHOLHDL, LDLDIRECT in the last 72 hours. Thyroid Function Tests: No results for input(s): TSH, T4TOTAL, FREET4, T3FREE, THYROIDAB in the last 72 hours. Anemia Panel: No results for input(s): VITAMINB12, FOLATE, FERRITIN, TIBC, IRON, RETICCTPCT in the last 72 hours. Sepsis Labs: No results  for input(s): PROCALCITON, LATICACIDVEN in the last 168 hours.  Recent Results (from the past 240 hour(s))  CSF culture     Status: None (Preliminary result)   Collection Time: 12/16/16  6:06 PM  Result Value Ref Range Status   Specimen Description CSF  Final   Special Requests NONE  Final   Gram Stain   Final    WBC PRESENT, PREDOMINANTLY MONONUCLEAR NO ORGANISMS SEEN CYTOSPIN SMEAR    Culture NO GROWTH < 24 HOURS  Final   Report Status PENDING  Incomplete  Culture, blood (routine x 2)     Status: None (Preliminary result)   Collection Time: 12/16/16  9:20 PM  Result Value Ref Range Status   Specimen Description BLOOD RIGHT ANTECUBITAL  Final   Special Requests   Final    BOTTLES DRAWN AEROBIC AND ANAEROBIC Blood Culture adequate volume   Culture NO GROWTH < 24 HOURS  Final   Report Status PENDING  Incomplete  Culture, blood (routine x 2)     Status: None (Preliminary result)   Collection Time: 12/16/16  9:25 PM  Result Value Ref Range Status   Specimen Description BLOOD RIGHT HAND  Final   Special Requests   Final    BOTTLES DRAWN AEROBIC AND ANAEROBIC Blood Culture adequate volume   Culture NO GROWTH < 24 HOURS  Final   Report Status PENDING  Incomplete         Radiology Studies: Ct Head Wo Contrast  Result Date: 12/16/2016 CLINICAL DATA:  Headache.  Concern for subarachnoid hemorrhage. EXAM: CT HEAD WITHOUT CONTRAST TECHNIQUE: Contiguous axial images were obtained from the base of the skull through the vertex without intravenous contrast. COMPARISON:  None. FINDINGS: Brain: No acute intracranial abnormality. Specifically, no hemorrhage, hydrocephalus, mass lesion, acute infarction, or significant intracranial injury. Vascular: No hyperdense vessel or unexpected calcification. Skull: No acute calvarial abnormality. Sinuses/Orbits: Rounded soft tissue densities noted in both maxillary sinuses. Remainder the paranasal sinuses are clear. Mastoid air cells clear. No orbital  scratched set orbital soft tissues unremarkable. Other: None IMPRESSION: No acute intracranial abnormality. Mucous retention cysts or polyps in the maxillary sinuses bilaterally. Electronically Signed   By: Charlett Nose M.D.   On: 12/16/2016 16:42   Dg Lumbar Puncture Fluoro Guide  Result Date: 12/16/2016 CLINICAL DATA:  Severe headache EXAM: DIAGNOSTIC LUMBAR PUNCTURE UNDER FLUOROSCOPIC GUIDANCE FLUOROSCOPY TIME:  Fluoroscopy Time:  18 seconds Radiation Exposure Index (if provided by the fluoroscopic device): Number of Acquired Spot Images: 0 PROCEDURE: Informed consent was obtained from the patient prior to the procedure, including potential complications of headache, allergy, and pain. With the patient prone, the lower back was prepped with Betadine. 1% Lidocaine was used for local anesthesia. Lumbar puncture was performed at the L4-5 level using  a 20 gauge needle with return of clear CSF with an opening pressure of 23 cm water. Six ml of CSF were obtained for laboratory studies. The patient tolerated the procedure well and there were no apparent complications. IMPRESSION: Technically successful lumbar puncture under fluoroscopic guidance. Electronically Signed   By: Charlett Nose M.D.   On: 12/16/2016 18:03        Scheduled Meds: Continuous Infusions: . acyclovir Stopped (12/17/16 1543)  . cefTRIAXone (ROCEPHIN)  IV Stopped (12/17/16 1049)  . vancomycin 1,000 mg (12/17/16 1647)     LOS: 1 day    Time spent: 30 m inutes.     Kathlen Mody, MD [facility] Pager [phone]  If 7PM-7AM, please contact night-coverage [email or web address] Password [facility] 12/17/2016, 4:56 PM

## 2016-12-17 NOTE — Progress Notes (Signed)
Patient is running a low grade T 100.  Tylenol PO was given to patient.  Patient and family is very concerned b/c patient is getting iv abx and did not have temp when he came in.  Nurse tried to explain that temp is low and is being addressed but they insist on speaking with doctor.  Sent message via Amion to on call.  Awaiting call back.

## 2016-12-17 NOTE — Progress Notes (Signed)
Received call on IP call phone from patient's nurse stating that patient's family had questions about risks to infant children in the home. Patient being worked up for meningitis. I advised that they contact their pediatrician to make him/her aware of potential exposure and that they should watch for any changes in childrens' wellbeing such as fretfulness, somnolence, decreased appetite, etc. No definitive diagnosis at this time.

## 2016-12-17 NOTE — Plan of Care (Signed)
Problem: Safety: Goal: Ability to remain free from injury will improve Outcome: Progressing Bed able to ambulate independently. Has non skid sock on and belongings in reach. Calls appropriately for assistance.

## 2016-12-18 DIAGNOSIS — B027 Disseminated zoster: Secondary | ICD-10-CM

## 2016-12-18 LAB — HERPES SIMPLEX VIRUS(HSV) DNA BY PCR
HSV 1 DNA: NEGATIVE
HSV 2 DNA: NEGATIVE

## 2016-12-18 LAB — VDRL, CSF: SYPHILIS VDRL QUANT CSF: NONREACTIVE

## 2016-12-18 MED ORDER — KETOROLAC TROMETHAMINE 15 MG/ML IJ SOLN
15.0000 mg | Freq: Four times a day (QID) | INTRAMUSCULAR | Status: DC | PRN
  Administered 2016-12-18 – 2016-12-21 (×3): 15 mg via INTRAVENOUS
  Filled 2016-12-18 (×3): qty 1

## 2016-12-18 NOTE — Consult Note (Addendum)
[facility]       Reason for Consult: viral meningitis    Referring Physician: Dr. Malachi BondsShort  Principal Problem:   Meningitis Active Problems:   Headache     Recommendations: IV acyclovir through July 31 if no alternative diagnosis found by tomorrow Stop vancomycin and ceftriaxone picc line tomorrow if no alternative diagnosis found in the next 24 hours No isolation at home indicated Keep rash area covered at home If baby develops rash to go to pediatrician, but otherwise, no other issues.   Assessment: He has viral meningitis in the setting of probable new shingles rash c/w VZV meningitis.  Optimal treatment is with IV acyclovir to reduce viral replication.  I will add on enterovirus and wait for HSV to see if alternative diagnosis identified (which will not require IV therapy at home).   [facility].   HIV negative  Antibiotics: Vancomycin, ceftriaxone, acyclovir  HPI: Kenneth Schaefer is a 36 y.o. male with no significant past medical history came in on 7/23 with headache, photophobia, nv and neck stiffness.  This had started about 3 days prior to this admission and also with abdominal pain.  He initailly [facility] and left AMA but returned.  CSF studies significant for 605 WBCs, 98% lymphs, protein of 92.  No known sick contacts.  Has 684 month old twins at home.  Now developing a single dermatome rash on his back and front.   Had varicella as a child.  Girlfriend at bedside also had varicella as a child.    Review of Systems:  Constitutional: negative for fevers, chills and malaise Respiratory: negative for cough or sputum Gastrointestinal: negative for nausea and diarrhea Integument/breast: negative for rash Musculoskeletal: negative for myalgias and arthralgias All other systems reviewed and are negative    Past Medical History:  Diagnosis Date  . Medical history  non-contributory     Social History  Substance Use Topics  . Smoking status: Current Every Day Smoker  . Smokeless tobacco: Never Used  . Alcohol use Yes    Family History  Problem Relation Age of Onset  . Obesity Mother     Allergies  Allergen Reactions  . Percocet [Oxycodone-Acetaminophen] Other (See Comments)    Makes him violent    Physical Exam: Constitutional: in no apparent distress and alert  Vitals:   12/17/16 2025 12/18/16 0540  BP: 126/64 123/62  Pulse: 70 (!) 56  Resp: 18 18  Temp: 100 F (37.8 C) 98.6 F (37 C)   EYES: anicteric ENMT: no thrush Cardiovascular: Cor RRR Respiratory: CTA B; normal respiratory effort GI: Bowel sounds are normal, liver is not enlarged, spleen is not enlarged Musculoskeletal: no pedal edema noted Skin: + erythematous dermatomal rash on torso   Lab Results  Component Value Date   WBC 15.0 (H) 12/17/2016   HGB 14.2 12/17/2016   HCT 40.5 12/17/2016   MCV 84.9 12/17/2016   PLT 194 12/17/2016    Lab Results  Component Value Date   CREATININE 1.12 12/17/2016   BUN 8 12/17/2016   NA 136 12/17/2016   K 3.6 12/17/2016   CL 104 12/17/2016   CO2 27 12/17/2016    Lab Results  Component Value Date   ALT 16 (L) 12/16/2016   AST 19 12/16/2016   ALKPHOS 34 (L) 12/16/2016     Microbiology: Recent Results (from the past 240 hour(s))  CSF culture     Status: None (Preliminary result)   Collection Time: 12/16/16  6:06 PM  Result Value Ref Range Status   Specimen Description CSF  Final   Special Requests NONE  Final   Gram Stain   Final    WBC PRESENT, PREDOMINANTLY MONONUCLEAR NO ORGANISMS SEEN CYTOSPIN SMEAR    Culture NO GROWTH 2 DAYS  Final   Report Status PENDING  Incomplete  Culture, blood (routine x 2)     Status: None (Preliminary result)   Collection Time: 12/16/16  9:20 PM  Result Value Ref Range Status   Specimen Description BLOOD RIGHT ANTECUBITAL  Final   Special Requests   Final    BOTTLES DRAWN  AEROBIC AND ANAEROBIC Blood Culture adequate volume   Culture NO GROWTH 2 DAYS  Final   Report Status PENDING  Incomplete  Culture, blood (routine x 2)     Status: None (Preliminary result)   Collection Time: 12/16/16  9:25 PM  Result Value Ref Range Status   Specimen Description BLOOD RIGHT HAND  Final   Special Requests   Final    BOTTLES DRAWN AEROBIC AND ANAEROBIC Blood Culture adequate volume   Culture NO GROWTH 2 DAYS  Final   Report Status PENDING  Incomplete    Staci RighterOMER, ROBERT, MD [facility] [email or web address] [phone] pager  [phone] cell 12/18/2016, 11:55 AM

## 2016-12-18 NOTE — Progress Notes (Addendum)
Patient and family member were still not satisfied even after speaking to on-call MD.  He remained irritated and argumentative.  "I don't understand why ya'll taking this so calmly, this is a serious thing".  I reassured patient and family that we are taking this matter seriously and that is why we are monitoring his vitals and he is getting several iv abx.  After he received the cocktail of Benadryl, Reglan, and Toradol the on call MD provided, he settled down and went to sleep.

## 2016-12-18 NOTE — Progress Notes (Signed)
Received call from patient's nurse that provider has reported that he has rash on his back evident of shingles. No blisters as of yet, however she believes this to be imminent. Instructed nurse to have patient moved to negative pressure room and to place patient on contact precautions, place a surgical mask on patient for transport and place on airborne and contact once he arrives to negative pressure room. Stressed the importance of instructing the mother to notify pediatrician of their two 804 month old twins of this development.

## 2016-12-18 NOTE — Progress Notes (Signed)
PROGRESS NOTE  Kenneth Schaefer  GEX:[id number] DOB: [birth year] DOA: 12/16/2016 PCP: Default, Provider, MD  Brief Narrative:   The patient is a 36 year old male without significant past medical history who presented with headache, photophobia, nausea vomiting, and neck stiffness.  His LP demonstrated elevated WBC (605), 98 percent lymphocytic, normal glucose and mildly elevated protein, consistent with viral meningitis.  Today, he noticed a rash over his left chest and back. He has developed shingles over what appears to be the T7 distribution. He has been on broad-spectrum antibiotics to treat bacterial meningitis as well as acyclovir.  I have stopped his antibiotics and continue the acyclovir while further labs are pending.  ID has consulted and agrees with stopping antibiotics. They recommended continuing IV acyclovir through 7/31. If cultures are still negative tomorrow and the results of his HSV and VDRL are also negative, he can have a PICC line placed for outpatient antivirals.   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  -  CSF no growth to date -  VDRL pending -  HSV pending -  ID has added enterovirus -  HIV negative -  Appreciate ID recommendations  Shingles - transfer to airborne precautions room -  Continue IV acyclovir -  Continue pain control with oral dilaudid -  Consider adding gabapentin (but currently rash is not painful)  DVT prophylaxis:  SCDS Code Status:  full Family Communication:  Patient and wife  Disposition Plan:  Likely home tomorrow with PICC line and IV acyclovir   Consultants:   ID  Procedures:  LP  Antimicrobials:  Anti-infectives    Start     Dose/Rate Route Frequency Ordered Stop   12/17/16 1000  cefTRIAXone (ROCEPHIN) 2 g in dextrose 5 % 50 mL IVPB  Status:  Discontinued     2 g 100 mL/hr over 30 Minutes Intravenous Every 12 hours 12/16/16  2139 12/16/16 2220   12/17/16 0900  cefTRIAXone (ROCEPHIN) 2 g in dextrose 5 % 50 mL IVPB  Status:  Discontinued     2 g 100 mL/hr over 30 Minutes Intravenous Every 12 hours 12/16/16 2220 12/18/16 1034   12/17/16 0800  vancomycin (VANCOCIN) IVPB 1000 mg/200 mL premix  Status:  Discontinued     1,000 mg 200 mL/hr over 60 Minutes Intravenous Every 8 hours 12/16/16 2219 12/18/16 1034   12/17/16 0500  acyclovir (ZOVIRAX) 900 mg in dextrose 5 % 150 mL IVPB     900 mg 168 mL/hr over 60 Minutes Intravenous Every 8 hours 12/16/16 2218     12/16/16 2230  vancomycin (VANCOCIN) IVPB 750 mg/150 ml premix     750 mg 150 mL/hr over 60 Minutes Intravenous  Once 12/16/16 2138 12/17/16 0210   12/16/16 2015  cefTRIAXone (ROCEPHIN) 2 g in dextrose 5 % 50 mL IVPB     2 g 100 mL/hr over 30 Minutes Intravenous  Once 12/16/16 2011 12/16/16 2134   12/16/16 2015  vancomycin (VANCOCIN) IVPB 1000 mg/200 mL premix     1,000 mg 200 mL/hr over 60 Minutes Intravenous  Once 12/16/16 2011 12/16/16 2300   12/16/16 2015  acyclovir (ZOVIRAX) 905 mg in dextrose 5 % 150 mL IVPB     10 mg/kg  90.7 kg 168.1 mL/hr over 60 Minutes Intravenous  Once 12/16/16 2014 12/16/16 2208      Subjective:  Headache has improved, but still having photophobia.  Found he has  a rash on his chest.  Noticed it about 5 minutes prior to my arrival today.  Denies pain over rashed area  Objective: Vitals:   12/17/16 0622 12/17/16 1527 12/17/16 2025 12/18/16 0540  BP: 105/86 (!) 123/58 126/64 123/62  Pulse: 70 70 70 (!) 56  Resp: 18 18 18 18   Temp: 99 F (37.2 C) 98.9 F (37.2 C) 100 F (37.8 C) 98.6 F (37 C)  TempSrc: Oral Oral Oral Oral  SpO2: 100% 100% 99% 99%  Weight:      Height:        Intake/Output Summary (Last 24 hours) at 12/18/16 1727 Last data filed at 12/17/16 1800  Gross per 24 hour  Intake              850 ml  Output                0 ml  Net              850 ml   Filed Weights   12/16/16 1459  Weight: 90.7 kg  (200 lb)    Examination:  General exam:  Adult male.  No acute distress.  HEENT:  NCAT, MMM Respiratory system: Clear to auscultation bilaterally Cardiovascular system: Regular rate and rhythm, normal S1/S2. No murmurs, rubs, gallops or clicks.  Warm extremities Gastrointestinal system: Normal active bowel sounds, soft, nondistended, nontender. MSK:  Normal tone and bulk, no lower extremity edema Neuro:  Grossly intact Derm:  Erythematous maculopapular rash without obvious vesicles along left likely T7 distribution    Data Reviewed: I have personally reviewed following labs and imaging studies  CBC:  Recent Labs Lab 12/16/16 1619 12/17/16 0520  WBC 11.2* 15.0*  NEUTROABS 9.9* 12.7*  HGB 14.7 14.2  HCT 42.4 40.5  MCV 84.8 84.9  PLT 192 194   Basic Metabolic Panel:  Recent Labs Lab 12/16/16 1619 12/17/16 0520  NA 138 136  K 4.0 3.6  CL 105 104  CO2 27 27  GLUCOSE 100* 100*  BUN 6 8  CREATININE 1.18 1.12  CALCIUM 8.8* 8.8*   GFR: Estimated Creatinine Clearance: 101 mL/min (by C-G formula based on SCr of 1.12 mg/dL). Liver Function Tests:  Recent Labs Lab 12/16/16 1619  AST 19  ALT 16*  ALKPHOS 34*  BILITOT 1.0  PROT 5.8*  ALBUMIN 3.7    Recent Labs Lab 12/16/16 1619  LIPASE 23   No results for input(s): AMMONIA in the last 168 hours. Coagulation Profile: No results for input(s): INR, PROTIME in the last 168 hours. Cardiac Enzymes: No results for input(s): CKTOTAL, CKMB, CKMBINDEX, TROPONINI in the last 168 hours. BNP (last 3 results) No results for input(s): PROBNP in the last 8760 hours. HbA1C: No results for input(s): HGBA1C in the last 72 hours. CBG: No results for input(s): GLUCAP in the last 168 hours. Lipid Profile: No results for input(s): CHOL, HDL, LDLCALC, TRIG, CHOLHDL, LDLDIRECT in the last 72 hours. Thyroid Function Tests: No results for input(s): TSH, T4TOTAL, FREET4, T3FREE, THYROIDAB in the last 72 hours. Anemia Panel: No  results for input(s): VITAMINB12, FOLATE, FERRITIN, TIBC, IRON, RETICCTPCT in the last 72 hours. Urine analysis: No results found for: COLORURINE, APPEARANCEUR, LABSPEC, PHURINE, GLUCOSEU, HGBUR, BILIRUBINUR, KETONESUR, PROTEINUR, UROBILINOGEN, NITRITE, LEUKOCYTESUR Sepsis Labs: @LABRCNTIP (procalcitonin:4,lacticidven:4)  ) Recent Results (from the past 240 hour(s))  CSF culture     Status: None (Preliminary result)   Collection Time: 12/16/16  6:06 PM  Result Value Ref Range Status   Specimen Description  CSF  Final   Special Requests NONE  Final   Gram Stain   Final    WBC PRESENT, PREDOMINANTLY MONONUCLEAR NO ORGANISMS SEEN CYTOSPIN SMEAR    Culture NO GROWTH 2 DAYS  Final   Report Status PENDING  Incomplete  Culture, blood (routine x 2)     Status: None (Preliminary result)   Collection Time: 12/16/16  9:20 PM  Result Value Ref Range Status   Specimen Description BLOOD RIGHT ANTECUBITAL  Final   Special Requests   Final    BOTTLES DRAWN AEROBIC AND ANAEROBIC Blood Culture adequate volume   Culture NO GROWTH 2 DAYS  Final   Report Status PENDING  Incomplete  Culture, blood (routine x 2)     Status: None (Preliminary result)   Collection Time: 12/16/16  9:25 PM  Result Value Ref Range Status   Specimen Description BLOOD RIGHT HAND  Final   Special Requests   Final    BOTTLES DRAWN AEROBIC AND ANAEROBIC Blood Culture adequate volume   Culture NO GROWTH 2 DAYS  Final   Report Status PENDING  Incomplete      Radiology Studies: Dg Lumbar Puncture Fluoro Guide  Result Date: 12/16/2016 CLINICAL DATA:  Severe headache EXAM: DIAGNOSTIC LUMBAR PUNCTURE UNDER FLUOROSCOPIC GUIDANCE FLUOROSCOPY TIME:  Fluoroscopy Time:  18 seconds Radiation Exposure Index (if provided by the fluoroscopic device): Number of Acquired Spot Images: 0 PROCEDURE: Informed consent was obtained from the patient prior to the procedure, including potential complications of headache, allergy, and pain. With the  patient prone, the lower back was prepped with Betadine. 1% Lidocaine was used for local anesthesia. Lumbar puncture was performed at the L4-5 level using a 20 gauge needle with return of clear CSF with an opening pressure of 23 cm water. Six ml of CSF were obtained for laboratory studies. The patient tolerated the procedure well and there were no apparent complications. IMPRESSION: Technically successful lumbar puncture under fluoroscopic guidance. Electronically Signed   By: Charlett NoseKevin  Dover M.D.   On: 12/16/2016 18:03     Scheduled Meds: Continuous Infusions: . acyclovir Stopped (12/18/16 1420)     LOS: 2 days    Time spent: 30 min    Renae FickleSHORT, Yeshaya Vath, MD [facility] Pager [phone]  If 7PM-7AM, please contact night-coverage [email or web address] Password [email or web address] 12/18/2016, 5:27 PM

## 2016-12-18 NOTE — Progress Notes (Signed)
Patient being transported to negative pressure room 5N32 due to Dr. Joan MayansShort's diagnosis of Shingles which calls for airborne and contact isolation. Patient and girlfriend notified. Also instructed girlfriend to contact pediatrician per infection prevention's recommendation regarding exposure to the couples 114 month old twins. They acknowledged understanding.

## 2016-12-19 DIAGNOSIS — R110 Nausea: Secondary | ICD-10-CM

## 2016-12-19 LAB — VARICELLA-ZOSTER BY PCR: Varicella-Zoster, PCR: POSITIVE — AB

## 2016-12-19 MED ORDER — ACETAMINOPHEN 325 MG PO TABS: 650.0000 mg | ORAL_TABLET | Freq: Three times a day (TID) | ORAL | Status: DC

## 2016-12-19 MED ORDER — PROMETHAZINE HCL 25 MG/ML IJ SOLN
12.5000 mg | Freq: Once | INTRAMUSCULAR | Status: AC
  Administered 2016-12-19: 12.5 mg via INTRAVENOUS
  Filled 2016-12-19: qty 1

## 2016-12-19 MED ORDER — SODIUM CHLORIDE 0.9% FLUSH
10.0000 mL | INTRAVENOUS | Status: DC | PRN
  Administered 2016-12-19 – 2016-12-21 (×2): 10 mL
  Filled 2016-12-19 (×2): qty 40

## 2016-12-19 MED ORDER — IBUPROFEN 200 MG PO TABS
800.0000 mg | ORAL_TABLET | Freq: Three times a day (TID) | ORAL | Status: DC
  Administered 2016-12-19 – 2016-12-21 (×8): 800 mg via ORAL
  Filled 2016-12-19 (×8): qty 4

## 2016-12-19 MED ORDER — DEXTROSE 5 % IV SOLN: 900.0000 mg | Freq: Three times a day (TID) | INTRAVENOUS | 0 refills | Status: DC

## 2016-12-19 MED ORDER — PROMETHAZINE HCL 12.5 MG PO TABS: 12.5000 mg | ORAL_TABLET | Freq: Four times a day (QID) | ORAL | 0 refills | Status: DC | PRN

## 2016-12-19 MED ORDER — IBUPROFEN 800 MG PO TABS: 800.0000 mg | ORAL_TABLET | Freq: Three times a day (TID) | ORAL | 0 refills | Status: DC | PRN

## 2016-12-19 NOTE — Progress Notes (Signed)
Patient informed that discharage will not take place this PM as no HH has been arranged.  PICC line established this afternoon as ordered. Advilsed patient that discharge should without fail take place tomorrow as placement of the PICC line was the missing piece of the puzzle for case management to push forward. Pt states his appreciation.

## 2016-12-19 NOTE — Care Management Note (Signed)
Case Management Note  Patient Details  Name: Kenneth Schaefer MRN: [id number] Date of Birth: 02-17-1981  Subjective/Objective:       shingles             Action/Plan: Discharge Planning: NCM spoke to pt and states he does work but is without insurance coverage. Will send referral to Financial Counselor. Pt will need IV abx until 7/31 at home. Explained to pt that NCM will [facility] to see if he qualifies for their charity program. [facility] (depends on cost of dc Rx).    Expected Discharge Date:                  Expected Discharge Plan:  Home w Home Health Services  In-House Referral:  NA  Discharge planning Services  CM Consult, Medication Assistance  Post Acute Care Choice:  Home Health Choice offered to:  Patient  DME Arranged:  N/A DME Agency:  NA  HH Arranged:  RN, IV Antibiotics HH Agency:  [facility]  Status of Service:  In process, will continue to follow  If discussed at Long Length of Stay Meetings, dates discussed:    Additional Comments:  [name], [name], RN 12/19/2016, 12:35 PM

## 2016-12-19 NOTE — Progress Notes (Signed)
PT girlfriend called nurse said pt has been throwing up since 0200. Messaged TR[facility].Will cont. To monitor

## 2016-12-19 NOTE — Progress Notes (Signed)
Pt called nurse into room because he had been throwing up. Nurse gave pt pain meds. Earlier and pt and nurse believe it was due to the pain meds. Given. Gave pt ginger ale and crackers and told him to call if he continues to throw up. Pt said he did not feel like he still had to throw up. Will cont. Mont.

## 2016-12-19 NOTE — Progress Notes (Signed)
Pharmacy Antibiotic Note Kenneth Schaefer is a 36 y.o. male admitted on 12/16/2016 with viral meningitis with rash consistent with shingles. Per ID, plan to use IV acyclovir through July 31st.   VZV PCR remains in progress.  Enterovirus PCR remains in progress -- if this is positive, will not need further IV acyclovir.  Plan: Continue Acyclovir 900 mg (10 mg/kg) IV every 8 hours.   End date 12/24/16. If discharging on IV Acyclovir, consider if OPAT should be placed to help with outpatient orders.   Height: 6' (182.9 cm) Weight: 200 lb (90.7 kg) IBW/kg (Calculated) : 77.6  Temp (24hrs), Avg:98.4 F (36.9 C), Min:97.6 F (36.4 C), Max:99.2 F (37.3 C)   Recent Labs Lab 12/16/16 1619 12/17/16 0520  WBC 11.2* 15.0*  CREATININE 1.18 1.12    Estimated Creatinine Clearance: 101 mL/min (by C-G formula based on SCr of 1.12 mg/dL).    Allergies  Allergen Reactions  . Percocet [Oxycodone-Acetaminophen] Other (See Comments)    Makes him violent   Microbiology: 7/23 CSF cx: pending 7/23 HSV PCR: in process 7/23 Blood cx: ngtd x3 days 7/23 VZV PCR: in process 7/23 Enterovirus PCR: in process  Thank you for allowing pharmacy to be a part of this patient's care.  Link SnufferJessica Altair Stanko, PharmD, BCPS Clinical Pharmacist Clinical Phone 12/19/2016 until 3:[phone] #[phone] After hours, please call [phone] 12/19/2016, 2:54 PM

## 2016-12-19 NOTE — Progress Notes (Signed)
Peripherally Inserted Central Catheter/Midline Placement  The IV Nurse has discussed with the patient and/or persons authorized to consent for the patient, the purpose of this procedure and the potential benefits and risks involved with this procedure.  The benefits include less needle sticks, lab draws from the catheter, and the patient may be discharged home with the catheter. Risks include, but not limited to, infection, bleeding, blood clot (thrombus formation), and puncture of an artery; nerve damage and irregular heartbeat and possibility to perform a PICC exchange if needed/ordered by physician.  Alternatives to this procedure were also discussed.  Bard Power PICC patient education guide, fact sheet on infection prevention and patient information card has been provided to patient /or left at bedside.    PICC/Midline Placement Documentation        Kenneth Schaefer, Kenneth Schaefer 12/19/2016, 4:18 PM

## 2016-12-19 NOTE — Discharge Summary (Signed)
Physician Discharge Summary  VERSIE FLEENER ZOX:[id number] DOB: Jul 25, 1980 DOA: 12/16/2016  PCP: Default, Provider, MD  Admit date: 12/16/2016 Discharge date: 12/19/2016  Admitted From: home  Disposition:  home  Recommendations for Outpatient Follow-up:  1. Acyclovir through 7/31, then stop.  PICC line may be removed at completion of therapy.  Home Health:  RN   Equipment/Devices:  PICC line  Discharge Condition:  Stable, improved CODE STATUS:  Full code  Diet recommendation:  Regular diet   Brief/Interim Summary:  The patient is a 36 year old male without significant past medical history who presented with headache, photophobia, nausea vomiting, and neck stiffness.  His LP demonstrated elevated WBC (605), 98 percent lymphocytic, normal glucose and mildly elevated protein, consistent with viral meningitis.  He was initially started on antibiotics for meningitis and acyclovir.  A day later, he noticed a rash over his left chest and back. He developed shingles over what appears to be around T7 distribution.  His CSF VDRL and HSV were negative.  Enterovirus PCR Is pending.  His antibiotics were discontinued once his CSF culture was negative at 48 hours.  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  PICC line was placed on 7/26 for home therapy.     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  Recommended ibuprofen and tylenol.   -  Consider adding gabapentin as outpatient. -  Recommended that he keep his rash covered to  prevent infection of his children, including two 51-month old twins.    Discharge Instructions  Discharge Instructions    Call MD for:  difficulty breathing, headache or visual disturbances    Complete by:  As directed    Call MD for:  extreme fatigue    Complete by:  As directed    Call MD for:  hives    Complete by:  As directed    Call MD for:  persistant dizziness or light-headedness    Complete by:  As directed    Call MD for:  persistant nausea and vomiting    Complete by:  As directed    Call MD for:  severe uncontrolled pain    Complete by:  As directed    Call MD for:  temperature >100.4    Complete by:  As directed    Diet general    Complete by:  As directed    Increase activity slowly    Complete by:  As directed        Medication List    STOP taking these medications   HYDROcodone-acetaminophen 5-325 MG tablet Commonly known as:  NORCO/VICODIN     TAKE these medications   acyclovir 900 mg in dextrose 5 % 150 mL Inject 900 mg into the vein every 8 (eight) hours.   ibuprofen 800 MG tablet Commonly known as:  ADVIL,MOTRIN Take 1 tablet (800 mg total) by mouth 3 (three) times daily as needed (pain). What changed:  medication strength  how much to take  when to  take this  reasons to take this   multivitamin tablet Take 1 tablet by mouth daily.   promethazine 12.5 MG tablet Commonly known as:  PHENERGAN Take 1 tablet (12.5 mg total) by mouth every 6 (six) hours as needed for nausea or vomiting.       Allergies  Allergen Reactions  . Percocet [Oxycodone-Acetaminophen] Other (See Comments)    Makes him violent    Consultations: Infectious disease   Procedures/Studies: Ct Head Wo Contrast  Result Date: 12/16/2016 CLINICAL DATA:  Headache.  Concern for subarachnoid hemorrhage. EXAM: CT HEAD WITHOUT CONTRAST TECHNIQUE: Contiguous axial images were obtained from the base of the skull through the vertex without intravenous contrast. COMPARISON:   None. FINDINGS: Brain: No acute intracranial abnormality. Specifically, no hemorrhage, hydrocephalus, mass lesion, acute infarction, or significant intracranial injury. Vascular: No hyperdense vessel or unexpected calcification. Skull: No acute calvarial abnormality. Sinuses/Orbits: Rounded soft tissue densities noted in both maxillary sinuses. Remainder the paranasal sinuses are clear. Mastoid air cells clear. No orbital scratched set orbital soft tissues unremarkable. Other: None IMPRESSION: No acute intracranial abnormality. Mucous retention cysts or polyps in the maxillary sinuses bilaterally. Electronically Signed   By: Charlett NoseKevin  Dover M.D.   On: 12/16/2016 16:42   Dg Lumbar Puncture Fluoro Guide  Result Date: 12/16/2016 CLINICAL DATA:  Severe headache EXAM: DIAGNOSTIC LUMBAR PUNCTURE UNDER FLUOROSCOPIC GUIDANCE FLUOROSCOPY TIME:  Fluoroscopy Time:  18 seconds Radiation Exposure Index (if provided by the fluoroscopic device): Number of Acquired Spot Images: 0 PROCEDURE: Informed consent was obtained from the patient prior to the procedure, including potential complications of headache, allergy, and pain. With the patient prone, the lower back was prepped with Betadine. 1% Lidocaine was used for local anesthesia. Lumbar puncture was performed at the L4-5 level using a 20 gauge needle with return of clear CSF with an opening pressure of 23 cm water. Six ml of CSF were obtained for laboratory studies. The patient tolerated the procedure well and there were no apparent complications. IMPRESSION: Technically successful lumbar puncture under fluoroscopic guidance. Electronically Signed   By: Charlett NoseKevin  Dover M.D.   On: 12/16/2016 18:03    Subjective: Headache continues to improve, photophobia is better.  Has not noticed any pain over his left sided rash which has remained in the same dermatomal distribution/not spread.    Discharge Exam: Vitals:   12/18/16 1956 12/19/16 0544  BP: 118/70 (!) 146/83  Pulse: (!)  52 (!) 51  Resp: 16 18  Temp: 97.6 F (36.4 C) 99.2 F (37.3 C)   Vitals:   12/17/16 2025 12/18/16 0540 12/18/16 1956 12/19/16 0544  BP: 126/64 123/62 118/70 (!) 146/83  Pulse: 70 (!) 56 (!) 52 (!) 51  Resp: 18 18 16 18   Temp: 100 F (37.8 C) 98.6 F (37 C) 97.6 F (36.4 C) 99.2 F (37.3 C)  TempSrc: Oral Oral Axillary Oral  SpO2: 99% 99% 100% 99%  Weight:      Height:        General: Pt is alert, awake, not in acute distress Cardiovascular: RRR, S1/S2 +, no rubs, no gallops Respiratory: CTA bilaterally, no wheezing, no rhonchi Abdominal: Soft, NT, ND, bowel sounds + Extremities: no edema, no cyanosis Derm:  Left T7 dermatome with erythamatous maculopapular and patchy rash.  Has a few some denuded vesicles which are crusted over on his back but no other vesicular-appearing lesions at this time.      The results of significant diagnostics from this hospitalization (including imaging, microbiology, ancillary and  laboratory) are listed below for reference.     Microbiology: Recent Results (from the past 240 hour(s))  CSF culture     Status: None (Preliminary result)   Collection Time: 12/16/16  6:06 PM  Result Value Ref Range Status   Specimen Description CSF  Final   Special Requests NONE  Final   Gram Stain   Final    WBC PRESENT, PREDOMINANTLY MONONUCLEAR NO ORGANISMS SEEN CYTOSPIN SMEAR    Culture NO GROWTH 3 DAYS  Final   Report Status PENDING  Incomplete  Culture, blood (routine x 2)     Status: None (Preliminary result)   Collection Time: 12/16/16  9:20 PM  Result Value Ref Range Status   Specimen Description BLOOD RIGHT ANTECUBITAL  Final   Special Requests   Final    BOTTLES DRAWN AEROBIC AND ANAEROBIC Blood Culture adequate volume   Culture NO GROWTH 3 DAYS  Final   Report Status PENDING  Incomplete  Culture, blood (routine x 2)     Status: None (Preliminary result)   Collection Time: 12/16/16  9:25 PM  Result Value Ref Range Status   Specimen  Description BLOOD RIGHT HAND  Final   Special Requests   Final    BOTTLES DRAWN AEROBIC AND ANAEROBIC Blood Culture adequate volume   Culture NO GROWTH 3 DAYS  Final   Report Status PENDING  Incomplete     Labs: BNP (last 3 results) No results for input(s): BNP in the last 8760 hours. Basic Metabolic Panel:  Recent Labs Lab 12/16/16 1619 12/17/16 0520  NA 138 136  K 4.0 3.6  CL 105 104  CO2 27 27  GLUCOSE 100* 100*  BUN 6 8  CREATININE 1.18 1.12  CALCIUM 8.8* 8.8*   Liver Function Tests:  Recent Labs Lab 12/16/16 1619  AST 19  ALT 16*  ALKPHOS 34*  BILITOT 1.0  PROT 5.8*  ALBUMIN 3.7    Recent Labs Lab 12/16/16 1619  LIPASE 23   No results for input(s): AMMONIA in the last 168 hours. CBC:  Recent Labs Lab 12/16/16 1619 12/17/16 0520  WBC 11.2* 15.0*  NEUTROABS 9.9* 12.7*  HGB 14.7 14.2  HCT 42.4 40.5  MCV 84.8 84.9  PLT 192 194   Cardiac Enzymes: No results for input(s): CKTOTAL, CKMB, CKMBINDEX, TROPONINI in the last 168 hours. BNP: Invalid input(s): POCBNP CBG: No results for input(s): GLUCAP in the last 168 hours. D-Dimer No results for input(s): DDIMER in the last 72 hours. Hgb A1c No results for input(s): HGBA1C in the last 72 hours. Lipid Profile No results for input(s): CHOL, HDL, LDLCALC, TRIG, CHOLHDL, LDLDIRECT in the last 72 hours. Thyroid function studies No results for input(s): TSH, T4TOTAL, T3FREE, THYROIDAB in the last 72 hours.  Invalid input(s): FREET3 Anemia work up No results for input(s): VITAMINB12, FOLATE, FERRITIN, TIBC, IRON, RETICCTPCT in the last 72 hours. Urinalysis No results found for: COLORURINE, APPEARANCEUR, LABSPEC, PHURINE, GLUCOSEU, HGBUR, BILIRUBINUR, KETONESUR, PROTEINUR, UROBILINOGEN, NITRITE, LEUKOCYTESUR Sepsis Labs Invalid input(s): PROCALCITONIN,  WBC,  LACTICIDVEN   Time coordinating discharge: Over 30 minutes  SIGNED:   Renae FickleSHORT, Saramarie Stinger, MD  [facility] 12/19/2016, 4:42 PM Pager    If 7PM-7AM, please contact night-coverage [email or web address] Password [email or web address]

## 2016-12-19 NOTE — Progress Notes (Signed)
    [facility]   Reason for visit: Follow up on viral meningitis  Interval History: headache and nausea with vomiting overnight.  Nausea after pain medication.  Still some photophobia.   No associated rash  Physical Exam: Constitutional:  Vitals:   12/18/16 1956 12/19/16 0544  BP: 118/70 (!) 146/83  Pulse: (!) 52 (!) 51  Resp: 16 18  Temp: 97.6 F (36.4 C) 99.2 F (37.3 C)   patient appears in NAD Eyes: anicteric HENT: no thrush Respiratory: Normal respiratory effort; CTA B Cardiovascular: RRR Skin: rash about the same, no blister/pustules  Review of Systems: Respiratory: negative for cough or sputum Neurological: positive for headaches, negative for dizziness  Lab Results  Component Value Date   WBC 15.0 (H) 12/17/2016   HGB 14.2 12/17/2016   HCT 40.5 12/17/2016   MCV 84.9 12/17/2016   PLT 194 12/17/2016    Lab Results  Component Value Date   CREATININE 1.12 12/17/2016   BUN 8 12/17/2016   NA 136 12/17/2016   K 3.6 12/17/2016   CL 104 12/17/2016   CO2 27 12/17/2016    Lab Results  Component Value Date   ALT 16 (L) 12/16/2016   AST 19 12/16/2016   ALKPHOS 34 (L) 12/16/2016     Microbiology: Recent Results (from the past 240 hour(s))  CSF culture     Status: None (Preliminary result)   Collection Time: 12/16/16  6:06 PM  Result Value Ref Range Status   Specimen Description CSF  Final   Special Requests NONE  Final   Gram Stain   Final    WBC PRESENT, PREDOMINANTLY MONONUCLEAR NO ORGANISMS SEEN CYTOSPIN SMEAR    Culture NO GROWTH 3 DAYS  Final   Report Status PENDING  Incomplete  Culture, blood (routine x 2)     Status: None (Preliminary result)   Collection Time: 12/16/16  9:20 PM  Result Value Ref Range Status   Specimen Description BLOOD RIGHT ANTECUBITAL  Final   Special Requests   Final    BOTTLES DRAWN AEROBIC AND ANAEROBIC Blood Culture adequate volume   Culture NO GROWTH 2 DAYS  Final   Report Status PENDING   Incomplete  Culture, blood (routine x 2)     Status: None (Preliminary result)   Collection Time: 12/16/16  9:25 PM  Result Value Ref Range Status   Specimen Description BLOOD RIGHT HAND  Final   Special Requests   Final    BOTTLES DRAWN AEROBIC AND ANAEROBIC Blood Culture adequate volume   Culture NO GROWTH 2 DAYS  Final   Report Status PENDING  Incomplete    Impression/Plan:  1. Viral meningitis - HSV negative, has a rash c/w shingles so most suspect VZV.  Will use IV acyclovir through July 31st.  Remove picc line after last dose If enterovirus is positive, he would not need further IV acyclovir  2. Nausea - he felt phenergan worked well and would like for discharge  3.  dispo - ok from ID standpoint to return to work, keep rash covered; asking for work note in case he doesn't feel well   He should follow up with a PCP, we are available if needed I will sign off, thanks for consultation

## 2016-12-19 NOTE — Progress Notes (Signed)
CM made aware by [facility] /Karen @ [phone] pt not approved [facility]  2/2 no nurse to offer in [place] area . CM made MD aware. Gae GallopAngela Kobe Jansma RN,BSN,CM

## 2016-12-20 DIAGNOSIS — B019 Varicella without complication: Secondary | ICD-10-CM

## 2016-12-20 LAB — BASIC METABOLIC PANEL
Anion gap: 8 (ref 5–15)
BUN: 8 mg/dL (ref 6–20)
CHLORIDE: 103 mmol/L (ref 101–111)
CO2: 29 mmol/L (ref 22–32)
CREATININE: 1.29 mg/dL — AB (ref 0.61–1.24)
Calcium: 8.9 mg/dL (ref 8.9–10.3)
GFR calc Af Amer: >60 mL/min (ref >60)
GFR calc non Af Amer: >60 mL/min (ref >60)
GLUCOSE: 109 mg/dL — AB (ref 65–99)
POTASSIUM: 3.7 mmol/L (ref 3.5–5.1)
SODIUM: 140 mmol/L (ref 135–145)

## 2016-12-20 LAB — CSF CULTURE: CULTURE: NO GROWTH

## 2016-12-20 LAB — CSF CULTURE W GRAM STAIN

## 2016-12-20 NOTE — Social Work (Signed)
CSW received call from Dr. Malachi BondsShort to f/u on if patient can go to SNF to receive IV medications since he has no insurance.  CSW followed up with clinical supervisor. SNF's will only take patient with shingles if they are non-contagious. It is not clear if iv medication can be covered by SNF. CSW called RNCM to get more information on the medication and she advised that patient wants to go home and she is following up with Dr. Mervyn SkeetersA.  CSW will [facility] to [facility] to follow-up further and advise if additional assistance is needed.  Keene BreathPatricia Nakkia Mackiewicz, LCSW Clinical Social Worker [phone]

## 2016-12-20 NOTE — Progress Notes (Signed)
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  No skilled nursing facility will accept.  Patient must remain hospitalized for the duration of his course.   - Continue ibuprofen with when necessary hydrocodone  DVT prophylaxis:  Ambulatory Code Status:  Full code Family Communication:  Patient and his wife Disposition Plan:  Late in the day on 7/31   Consultants:   Infection disease  Procedures:  None  Antimicrobials:  Anti-infectives    Start     Dose/Rate Route Frequency Ordered Stop   12/19/16 0000  acyclovir 900 mg in dextrose 5 % 150 mL     900 mg 168 mL/hr over 60 Minutes Intravenous Every 8 hours 12/19/16 1642     12/17/16 1000  cefTRIAXone (ROCEPHIN) 2 g in dextrose 5 % 50 mL IVPB  Status:  Discontinued     2 g 100 mL/hr over 30 Minutes Intravenous Every 12 hours 12/16/16 2139 12/16/16 2220   12/17/16 0900  cefTRIAXone (ROCEPHIN) 2 g in dextrose 5 % 50 mL IVPB  Status:  Discontinued     2 g 100 mL/hr over 30 Minutes Intravenous Every 12 hours 12/16/16 2220 12/18/16 1034   12/17/16 0800  vancomycin (VANCOCIN) IVPB 1000 mg/200 mL premix  Status:  Discontinued     1,000 mg 200 mL/hr over 60 Minutes Intravenous Every 8 hours 12/16/16 2219 12/18/16 1034   12/17/16 0500  acyclovir (ZOVIRAX) 900 mg in dextrose 5 % 150 mL IVPB     900 mg 168 mL/hr over 60 Minutes Intravenous Every 8 hours 12/16/16 2218     12/16/16 2230  vancomycin (VANCOCIN) IVPB 750 mg/150 ml premix     750 mg 150 mL/hr over 60 Minutes Intravenous  Once 12/16/16 2138 12/17/16 0210   12/16/16 2015   cefTRIAXone (ROCEPHIN) 2 g in dextrose 5 % 50 mL IVPB     2 g 100 mL/hr over 30 Minutes Intravenous  Once 12/16/16 2011 12/16/16 2134   12/16/16 2015  vancomycin (VANCOCIN) IVPB 1000 mg/200 mL premix     1,000 mg 200 mL/hr over 60 Minutes Intravenous  Once 12/16/16 2011 12/16/16 2300   12/16/16 2015  acyclovir (ZOVIRAX) 905 mg in dextrose 5 % 150 mL IVPB     10 mg/kg  90.7 kg 168.1 mL/hr over 60 Minutes Intravenous  Once 12/16/16 2014 12/16/16 2208       Subjective:  Frustrated because he thought he was given a be able to go home, however we were unable to arrange any home health services to provide IV antibiotic through charity. Headache is better, he is sitting up in bed, photophobia has improved. His rash looks better.  Objective: Vitals:   12/18/16 0540 12/18/16 1956 12/19/16 0544 12/20/16 0547  BP: 123/62 118/70 (!) 146/83 129/80  Pulse: (!) 56 (!) 52 (!) 51 (!) 49  Resp: 18 16 18 17   Temp: 98.6 F (37 C) 97.6 F (36.4 C) 99.2 F (37.3 C) 98.7 F (37.1 C)  TempSrc: Oral Axillary Oral Oral  SpO2: 99% 100% 99% 100%  Weight:      Height:  Intake/Output Summary (Last 24 hours) at 12/20/16 1434 Last data filed at 12/20/16 0830  Gross per 24 hour  Intake              490 ml  Output                0 ml  Net              490 ml   Filed Weights   12/16/16 1459  Weight: 90.7 kg (200 lb)    Examination:  General exam:  Adult Male.  No acute distress.  Sitting up on the edge of the bed  Respiratory system: Clear to auscultation bilaterally Cardiovascular system: Regular rate and rhythm, normal S1/S2. No murmurs, rubs, gallops or clicks.  Warm extremities Derm:  Plaques and erythematous papules along the left chest are less bright red, no new vesicles, appears improved   Data Reviewed: I have personally reviewed following labs and imaging studies  CBC:  Recent Labs Lab 12/16/16 1619 12/17/16 0520  WBC 11.2* 15.0*  NEUTROABS 9.9* 12.7*  HGB 14.7 14.2    HCT 42.4 40.5  MCV 84.8 84.9  PLT 192 194   Basic Metabolic Panel:  Recent Labs Lab 12/16/16 1619 12/17/16 0520 12/20/16 1101  NA 138 136 140  K 4.0 3.6 3.7  CL 105 104 103  CO2 27 27 29   GLUCOSE 100* 100* 109*  BUN 6 8 8   CREATININE 1.18 1.12 1.29*  CALCIUM 8.8* 8.8* 8.9   GFR: Estimated Creatinine Clearance: 87.7 mL/min (A) (by C-G formula based on SCr of 1.29 mg/dL (H)). Liver Function Tests:  Recent Labs Lab 12/16/16 1619  AST 19  ALT 16*  ALKPHOS 34*  BILITOT 1.0  PROT 5.8*  ALBUMIN 3.7    Recent Labs Lab 12/16/16 1619  LIPASE 23   No results for input(s): AMMONIA in the last 168 hours. Coagulation Profile: No results for input(s): INR, PROTIME in the last 168 hours. Cardiac Enzymes: No results for input(s): CKTOTAL, CKMB, CKMBINDEX, TROPONINI in the last 168 hours. BNP (last 3 results) No results for input(s): PROBNP in the last 8760 hours. HbA1C: No results for input(s): HGBA1C in the last 72 hours. CBG: No results for input(s): GLUCAP in the last 168 hours. Lipid Profile: No results for input(s): CHOL, HDL, LDLCALC, TRIG, CHOLHDL, LDLDIRECT in the last 72 hours. Thyroid Function Tests: No results for input(s): TSH, T4TOTAL, FREET4, T3FREE, THYROIDAB in the last 72 hours. Anemia Panel: No results for input(s): VITAMINB12, FOLATE, FERRITIN, TIBC, IRON, RETICCTPCT in the last 72 hours. Urine analysis: No results found for: COLORURINE, APPEARANCEUR, LABSPEC, PHURINE, GLUCOSEU, HGBUR, BILIRUBINUR, KETONESUR, PROTEINUR, UROBILINOGEN, NITRITE, LEUKOCYTESUR Sepsis Labs: @LABRCNTIP (procalcitonin:4,lacticidven:4)  ) Recent Results (from the past 240 hour(s))  CSF culture     Status: None   Collection Time: 12/16/16  6:06 PM  Result Value Ref Range Status   Specimen Description CSF  Final   Special Requests NONE  Final   Gram Stain   Final    WBC PRESENT, PREDOMINANTLY MONONUCLEAR NO ORGANISMS SEEN CYTOSPIN SMEAR    Culture NO GROWTH 3 DAYS   Final   Report Status 12/20/2016 FINAL  Final  Culture, blood (routine x 2)     Status: None (Preliminary result)   Collection Time: 12/16/16  9:20 PM  Result Value Ref Range Status   Specimen Description BLOOD RIGHT ANTECUBITAL  Final   Special Requests   Final    BOTTLES DRAWN AEROBIC AND ANAEROBIC Blood Culture adequate  volume   Culture NO GROWTH 4 DAYS  Final   Report Status PENDING  Incomplete  Culture, blood (routine x 2)     Status: None (Preliminary result)   Collection Time: 12/16/16  9:25 PM  Result Value Ref Range Status   Specimen Description BLOOD RIGHT HAND  Final   Special Requests   Final    BOTTLES DRAWN AEROBIC AND ANAEROBIC Blood Culture adequate volume   Culture NO GROWTH 4 DAYS  Final   Report Status PENDING  Incomplete      Radiology Studies: No results found.   Scheduled Meds: . ibuprofen  800 mg Oral TID   Continuous Infusions: . acyclovir 900 mg (12/20/16 1319)     LOS: 4 days    Time spent: 30 min    Renae FickleSHORT, Sherlynn Tourville, MD [facility] Pager [phone]  If 7PM-7AM, please contact night-coverage [email or web address] Password [email or web address] 12/20/2016, 2:34 PM

## 2016-12-20 NOTE — Progress Notes (Addendum)
[facility] on 12/26/2016 at 1030 am. Isidoro DonningAlesia Brandi Armato RN CCM Case Mgmt phone [phone]  NCM did follow up with Medical Advisor to discuss Home IV abx. NCM spoke to pt at bedside. [facility] to [facility] for [facility] abx. Pt states he did understand but was happy he would not be able to dc home. Consult to Artistinancial Counselor. Isidoro DonningAlesia Jazir Newey RN CCM Case Mgmt phone [phone]

## 2016-12-21 DIAGNOSIS — B021 Zoster meningitis: Principal | ICD-10-CM

## 2016-12-21 DIAGNOSIS — B027 Disseminated zoster: Secondary | ICD-10-CM

## 2016-12-21 DIAGNOSIS — B029 Zoster without complications: Secondary | ICD-10-CM

## 2016-12-21 LAB — CULTURE, BLOOD (ROUTINE X 2)
CULTURE: NO GROWTH
Culture: NO GROWTH
Special Requests: ADEQUATE
Special Requests: ADEQUATE

## 2016-12-21 LAB — ENTEROVIRUS PCR: Enterovirus PCR: NEGATIVE

## 2016-12-21 MED ORDER — VALACYCLOVIR HCL 1 G PO TABS: 1000.0000 mg | ORAL_TABLET | Freq: Three times a day (TID) | ORAL | 0 refills | Status: AC

## 2016-12-21 MED ORDER — POLYETHYLENE GLYCOL 3350 17 G PO PACK
34.0000 g | PACK | Freq: Once | ORAL | Status: AC
  Administered 2016-12-21: 34 g via ORAL
  Filled 2016-12-21: qty 2

## 2016-12-21 NOTE — Discharge Summary (Addendum)
Physician Discharge Summary  Kenneth Schaefer ZOX:[id number] DOB: 1980/09/24 DOA: 12/16/2016  PCP: Default, Provider, MD  Admit date: 12/16/2016 Left AMA on: 12/21/2016  Admitted From: home  Disposition:  Patient left AMA  Recommendations for Outpatient Follow-up:  Given Valtrex 1g TID to complete 4 more days of therapy [facility]   Home Health:  none Equipment/Devices:  none  Discharge Condition:  Stable, improved CODE STATUS:  Full code  Diet recommendation:  Regular diet   Brief/Interim Summary:  The patient is a 36 year old male without significant past medical history who presented with headache, photophobia, nausea vomiting, and neck stiffness.  His LP demonstrated elevated WBC (605), 98 percent lymphocytic, normal glucose and mildly elevated protein, consistent with viral meningitis.  He was initially started on antibiotics for meningitis and acyclovir.  A day later, he noticed a rash over his left chest and back. He developed shingles over what appears to be around T7 distribution.  His CSF VDRL and HSV were negative.  Enterovirus PCR Is pending.  His antibiotics were discontinued once his CSF culture was negative at 48 hours.  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  PICC line was placed on 7/26 for home therapy, however, [facility] was unable to provide a charity nurse.  Patient was advised he would remain hospitalized and we could readdress home health or SNF on Monday.  He left against medical advice on 7/28.  He was advised that he has disseminated viral infection that the standard of care is 10 days of IV therapy and that oral medication is not studied.  He is risking worsening meningitis/infection and possible death by ending his recommended therapy early, however, he stated that despite these risks he would rather go home.  His PICC line was removed and he was given a prescription for valtrex 1g TID.     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  Recommended ibuprofen and tylenol.   -  Consider adding gabapentin as outpatient. -  Recommended that he keep his rash covered to prevent infection of his children, including two 5558-month old twins.    Discharge Instructions  Discharge Instructions    Call MD for:  difficulty breathing, headache or visual disturbances    Complete by:  As directed    Call MD for:  extreme fatigue    Complete by:  As directed    Call MD for:  hives    Complete by:  As directed    Call MD for:  persistant dizziness or light-headedness    Complete by:  As directed    Call MD for:  persistant nausea and vomiting    Complete by:  As directed    Call MD for:  severe uncontrolled pain    Complete by:  As directed    Call MD for:  temperature >100.4    Complete by:  As directed    Diet - low sodium heart healthy    Complete by:  As directed    Diet general    Complete by:  As directed    Discharge instructions    Complete by:  As directed    To Whom It May Concern,  Kenneth Schaefer was [facility] from 12/16/2016 to 12/21/2016.   Increase activity slowly    Complete by:  As directed    Increase activity slowly    Complete by:  As directed        Medication List    STOP taking these medications   HYDROcodone-acetaminophen 5-325 MG tablet Commonly known as:  NORCO/VICODIN     TAKE these medications   ibuprofen 800 MG tablet Commonly known as:  ADVIL,MOTRIN Take 1 tablet (800 mg total) by mouth 3 (three)  times daily as needed (pain). What changed:  medication strength  how much to take  when to take this  reasons to take this   multivitamin tablet Take 1 tablet by mouth daily.   promethazine 12.5 MG tablet Commonly known as:  PHENERGAN Take 1 tablet (12.5 mg total) by mouth every 6 (six) hours as needed for nausea or vomiting.   valACYclovir 1000 MG tablet Commonly known as:  VALTREX Take 1 tablet (1,000 mg total) by mouth 3 (three) times daily.      Follow-up Information    [facility] Follow up.   Specialty:  Internal Medicine Why:  appointment scheduled for December 26, 2016 at 1030 am, please bring a photo ID, list of medications and/or discharge instructions Contact information: [address] [place] [address] [phone]         Allergies  Allergen Reactions  . Percocet [Oxycodone-Acetaminophen] Other (See Comments)    Makes him violent    Consultations: Infectious disease   Procedures/Studies: Ct Head Wo Contrast  Result Date: 12/16/2016 CLINICAL DATA:  Headache.  Concern for subarachnoid hemorrhage. EXAM: CT HEAD WITHOUT CONTRAST TECHNIQUE: Contiguous axial images were obtained from the base of the skull through the vertex without intravenous contrast. COMPARISON:  None. FINDINGS: Brain: No acute intracranial abnormality. Specifically, no hemorrhage, hydrocephalus, mass lesion, acute infarction, or significant intracranial injury. Vascular: No hyperdense vessel or unexpected calcification. Skull: No acute calvarial abnormality. Sinuses/Orbits: Rounded soft tissue densities noted in both maxillary sinuses. Remainder the paranasal sinuses are clear. Mastoid air cells clear. No orbital scratched set orbital soft tissues unremarkable. Other: None IMPRESSION: No acute intracranial abnormality. Mucous retention cysts or polyps in the maxillary sinuses bilaterally. Electronically Signed   By: Charlett Nose M.D.   On: 12/16/2016  16:42   Dg Lumbar Puncture Fluoro Guide  Result Date: 12/16/2016 CLINICAL DATA:  Severe headache EXAM: DIAGNOSTIC LUMBAR PUNCTURE UNDER FLUOROSCOPIC GUIDANCE FLUOROSCOPY TIME:  Fluoroscopy Time:  18 seconds Radiation Exposure Index (if provided by the fluoroscopic device): Number of Acquired Spot Images: 0 PROCEDURE: Informed consent was obtained from the patient prior to the procedure, including potential complications of headache, allergy, and pain. With the patient prone, the lower back was prepped with Betadine. 1% Lidocaine was used for local anesthesia. Lumbar puncture was performed at the L4-5 level using a 20 gauge needle with return of clear CSF with an opening pressure of 23 cm water. Six ml of CSF were obtained for laboratory studies. The patient tolerated the procedure well and there were no apparent complications. IMPRESSION: Technically successful lumbar puncture under fluoroscopic guidance. Electronically Signed   By: Charlett Nose M.D.   On: 12/16/2016 18:03  Subjective: He feels much better.  Rash is disappearing.  Eating and drinking well, but not pooping.  Wants medication for constipation.    Discharge Exam: Vitals:   12/21/16 0600 12/21/16 1526  BP: 118/79 130/73  Pulse: 65 63  Resp: 16 16  Temp: 98.6 F (37 C) 98.6 F (37 C)   Vitals:   12/20/16 1541 12/20/16 1945 12/21/16 0600 12/21/16 1526  BP: 132/68 (!) 156/86 118/79 130/73  Pulse: 66 (!) 51 65 63  Resp:  14 16 16   Temp: 98.9 F (37.2 C) 98.7 F (37.1 C) 98.6 F (37 C) 98.6 F (37 C)  TempSrc: Oral Oral Oral Oral  SpO2: 99% 100% 100% 100%  Weight:      Height:        General:  Awake, alert, no acute distress Cardiovascular: RRR no murmur Respiratory: CTAB Abdominal: Soft, NT, ND, bowel sounds + Extremities:  no edema, no cyanosis Derm:  Left T7 dermatome:  Anterior chest with mostly disappeared erythamatous maculopapular rash.  Rash remains more obvious over his back but this is also less red and  area has shrunk.     The results of significant diagnostics from this hospitalization (including imaging, microbiology, ancillary and laboratory) are listed below for reference.     Microbiology: Recent Results (from the past 240 hour(s))  CSF culture     Status: None   Collection Time: 12/16/16  6:06 PM  Result Value Ref Range Status   Specimen Description CSF  Final   Special Requests NONE  Final   Gram Stain   Final    WBC PRESENT, PREDOMINANTLY MONONUCLEAR NO ORGANISMS SEEN CYTOSPIN SMEAR    Culture NO GROWTH 3 DAYS  Final   Report Status 12/20/2016 FINAL  Final  Culture, blood (routine x 2)     Status: None   Collection Time: 12/16/16  9:20 PM  Result Value Ref Range Status   Specimen Description BLOOD RIGHT ANTECUBITAL  Final   Special Requests   Final    BOTTLES DRAWN AEROBIC AND ANAEROBIC Blood Culture adequate volume   Culture NO GROWTH 5 DAYS  Final   Report Status 12/21/2016 FINAL  Final  Culture, blood (routine x 2)     Status: None   Collection Time: 12/16/16  9:25 PM  Result Value Ref Range Status   Specimen Description BLOOD RIGHT HAND  Final   Special Requests   Final    BOTTLES DRAWN AEROBIC AND ANAEROBIC Blood Culture adequate volume   Culture NO GROWTH 5 DAYS  Final   Report Status 12/21/2016 FINAL  Final     Labs: BNP (last 3 results) No results for input(s): BNP in the last 8760 hours. Basic Metabolic Panel:  Recent Labs Lab 12/16/16 1619 12/17/16 0520 12/20/16 1101  NA 138 136 140  K 4.0 3.6 3.7  CL 105 104 103  CO2 27 27 29   GLUCOSE 100* 100* 109*  BUN 6 8 8   CREATININE 1.18 1.12 1.29*  CALCIUM 8.8* 8.8* 8.9   Liver Function Tests:  Recent Labs Lab 12/16/16 1619  AST 19  ALT 16*  ALKPHOS 34*  BILITOT 1.0  PROT 5.8*  ALBUMIN 3.7    Recent Labs Lab 12/16/16 1619  LIPASE 23   No results for input(s): AMMONIA in the last 168 hours. CBC:  Recent Labs Lab 12/16/16 1619 12/17/16 0520  WBC 11.2* 15.0*  NEUTROABS 9.9*  12.7*  HGB 14.7 14.2  HCT 42.4 40.5  MCV 84.8 84.9  PLT  192 194     Time coordinating discharge: Over 30 minutes  SIGNED:   Renae FickleSHORT, Suraj Ramdass, MD  [facility] 12/21/2016, 4:50 PM Pager   If 7PM-7AM, please contact night-coverage [email or web address] Password [email or web address]

## 2016-12-21 NOTE — Progress Notes (Signed)
Patient PICC line removed prior to discharge by PICC team. Dressing to right upper arm clean and dry at discharge.

## 2016-12-21 NOTE — Progress Notes (Signed)
Patient anxious to leave unit. Will leave AMA.Patient  Discussed with Dr. Malachi BondsShort at length pros and cons and patients desire is to leave hospital. MD has provided prescriptions for patient to have filled at discharge.  All discharge information reviewed with patient and rx given and explained. All personal belongings with patient. No c/o, no distress noted. Family with pt. Patient declines wheelchair escort to door. States hs will walk.

## 2016-12-21 NOTE — Progress Notes (Signed)
Pt desires to speak with physician. Physician notified via test that patient wants to talk with her. Patient is wanting to discharge home today. Explained to patient that his course of abx won't be complete until Tues.

## 2016-12-26 ENCOUNTER — Ambulatory Visit: Payer: Self-pay | Admitting: Family Medicine

## 2019-01-18 ENCOUNTER — Ambulatory Visit (HOSPITAL_COMMUNITY)
Admission: EM | Admit: 2019-01-18 | Discharge: 2019-01-18 | Disposition: A | Discharge status: Home / Self Care | Payer: Self-pay | Attending: Family Medicine | Admitting: Family Medicine

## 2019-01-18 ENCOUNTER — Other Ambulatory Visit: Payer: Self-pay

## 2019-01-18 ENCOUNTER — Encounter (HOSPITAL_COMMUNITY): Payer: Self-pay

## 2019-01-18 DIAGNOSIS — M542 Cervicalgia: Secondary | ICD-10-CM

## 2019-01-18 DIAGNOSIS — T63441A Toxic effect of venom of bees, accidental (unintentional), initial encounter: Secondary | ICD-10-CM

## 2019-01-18 MED ORDER — IBUPROFEN 800 MG PO TABS: 800.0000 mg | ORAL_TABLET | Freq: Four times a day (QID) | ORAL | 0 refills | Status: DC | PRN

## 2019-01-18 MED ORDER — TIZANIDINE HCL 4 MG PO TABS: 4.0000 mg | ORAL_TABLET | Freq: Four times a day (QID) | ORAL | 0 refills | Status: DC | PRN

## 2019-01-18 NOTE — ED Triage Notes (Signed)
Pt was stung by a bee on the back of his neck today at work. this happened at about 2 pm.

## 2019-01-18 NOTE — ED Provider Notes (Signed)
[facility]    CSN: [id number] Arrival date & time: 01/18/19  1911      History   Chief Complaint Chief Complaint  Patient presents with  . bee sting    HPI Kenneth Schaefer is a 38 y.o. male.   HPI  Stung by a bee at work today.  Now the muscles in the back of his neck are sore.  He states he has trouble turning his head.  He is afraid he will not be able to sleep tonight.  No numbness or weakness into the arms.  He wants the back of his neck checked to make sure there is no stinger.  He has no known allergies to bees  Past Medical History:  Diagnosis Date  . Medical history non-contributory     Patient Active Problem List   Diagnosis Date Noted  . 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 Shingles 12/21/2016  . 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 Headache 12/16/2016    History reviewed. No pertinent surgical history.     Home Medications    Prior to Admission medications   Medication Sig Start Date End Date Taking? Authorizing Provider  ibuprofen (ADVIL) 800 MG tablet Take 1 tablet (800 mg total) by mouth every 6 (six) hours as needed (pain). 01/18/19   Raylene Everts, MD  Multiple Vitamin (MULTIVITAMIN) tablet Take 1 tablet by mouth daily.    [provider]  promethazine (PHENERGAN) 12.5 MG tablet Take 1 tablet (12.5 mg total) by mouth every 6 (six) hours as needed for nausea or vomiting. 12/19/16   Janece Canterbury, MD  tiZANidine (ZANAFLEX) 4 MG tablet Take 1-2 tablets (4-8 mg total) by mouth every 6 (six) hours as needed for muscle spasms. 01/18/19   Raylene Everts, MD    Family History Family History  Problem Relation Age of Onset  . Obesity Mother     Social History Social History   Tobacco Use  . Smoking status: Current Every Day Smoker  . Smokeless tobacco: Never Used  Substance Use Topics  . Alcohol use: Yes  . Drug use: No     Allergies   Percocet [oxycodone-acetaminophen]   Review of Systems  Review of Systems  Constitutional: Negative for chills and fever.  HENT: Negative for ear pain and sore throat.   Eyes: Negative for pain and visual disturbance.  Respiratory: Negative for cough and shortness of breath.   Cardiovascular: Negative for chest pain and palpitations.  Gastrointestinal: Negative for abdominal pain and vomiting.  Genitourinary: Negative for dysuria and hematuria.  Musculoskeletal: Positive for neck pain and neck stiffness. Negative for arthralgias and back pain.  Skin: Negative for color change and rash.  Neurological: Negative for seizures and syncope.  All other systems reviewed and are negative.    Physical Exam Triage Vital Signs ED Triage Vitals  Enc Vitals Group     BP 01/18/19 2011 (!) 142/95     Pulse Rate 01/18/19 2011 65     Resp 01/18/19 2011 18     Temp 01/18/19 2011 98.4 F (36.9 C)     Temp Source 01/18/19 2011 Oral     SpO2 01/18/19 2011 100 %     Weight 01/18/19 2013 205 lb (93 kg)     Height --      Head Circumference --      Peak Flow --      Pain Score 01/18/19 2011 7     Pain Loc --  Pain Edu? --      Excl. in GC? --    No data found.  Updated Vital Signs BP (!) 142/95 (BP Location: Right Arm)   Pulse 65   Temp 98.4 F (36.9 C) (Oral)   Resp 18   Wt 93 kg   SpO2 100%   BMI 27.80 kg/m      Physical Exam Constitutional:      General: He is not in acute distress.    Appearance: He is well-developed.  HENT:     Head: Normocephalic and atraumatic.  Eyes:     Conjunctiva/sclera: Conjunctivae normal.     Pupils: Pupils are equal, round, and reactive to light.  Neck:     Musculoskeletal: Normal range of motion and neck supple. No muscular tenderness.     Comments: Neck is full range of motion.  There is mild tenderness in the paraspinous cervical muscles.  I do not visualize a bee sting.  There is some mild warmth to the skin. Cardiovascular:     Rate and Rhythm: Normal rate.  Pulmonary:     Effort: Pulmonary  effort is normal. No respiratory distress.  Abdominal:     General: There is no distension.     Palpations: Abdomen is soft.  Musculoskeletal: Normal range of motion.  Skin:    General: Skin is warm and dry.  Neurological:     Mental Status: He is alert.      UC Treatments / Results  Labs (all labs ordered are listed, but only abnormal results are displayed) Labs Reviewed - No data to display  EKG   Radiology No results found.  Procedures Procedures (including critical care time)  Medications Ordered in UC Medications - No data to display  Initial Impression / Assessment and Plan / UC Course  I have reviewed the triage vital signs and the nursing notes.  Pertinent labs & imaging results that were available during my care of the patient were reviewed by me and considered in my medical decision making (see chart for details).     Muscular neck pain after bee sting.  No allergic reaction Final Clinical Impressions(s) / UC Diagnoses   Final diagnoses:  Bee sting, accidental or unintentional, initial encounter  Neck pain     Discharge Instructions     Ice to area Ibuprofen 3 times a day with food for any pain and inflammation Take tizanidine as needed for muscle relaxer.  This is useful to take at bedtime You should be able to return to work tomorrow   ED Prescriptions    Medication Sig Dispense Auth. Provider   ibuprofen (ADVIL) 800 MG tablet  (Status: Discontinued) Take 1 tablet (800 mg total) by mouth every 6 (six) hours as needed (pain). 30 tablet Eustace MooreNelson, Zan Triska Sue, MD   tiZANidine (ZANAFLEX) 4 MG tablet Take 1-2 tablets (4-8 mg total) by mouth every 6 (six) hours as needed for muscle spasms. 21 tablet Eustace MooreNelson, Schylar Allard Sue, MD   ibuprofen (ADVIL) 800 MG tablet Take 1 tablet (800 mg total) by mouth every 6 (six) hours as needed (pain). 30 tablet Eustace MooreNelson, Sadhana Frater Sue, MD     Controlled Substance Prescriptions [place] Controlled Substance Registry consulted? Not  Applicable   Eustace MooreNelson, Robinn Overholt Sue, MD 01/18/19 2136

## 2019-01-18 NOTE — Discharge Instructions (Signed)
Ice to area Ibuprofen 3 times a day with food for any pain and inflammation Take tizanidine as needed for muscle relaxer.  This is useful to take at bedtime You should be able to return to work tomorrow

## 2022-07-02 ENCOUNTER — Encounter (HOSPITAL_BASED_OUTPATIENT_CLINIC_OR_DEPARTMENT_OTHER): Payer: Self-pay | Admitting: Emergency Medicine

## 2022-07-02 ENCOUNTER — Other Ambulatory Visit: Payer: Self-pay

## 2022-07-02 DIAGNOSIS — L02214 Cutaneous abscess of groin: Secondary | ICD-10-CM | POA: Diagnosis present

## 2022-07-02 DIAGNOSIS — Z87891 Personal history of nicotine dependence: Secondary | ICD-10-CM | POA: Insufficient documentation

## 2022-07-02 NOTE — ED Triage Notes (Signed)
Pt c/o ingrown hair or abscess on groin x 5 days.

## 2022-07-03 ENCOUNTER — Encounter (HOSPITAL_BASED_OUTPATIENT_CLINIC_OR_DEPARTMENT_OTHER): Payer: Self-pay

## 2022-07-03 ENCOUNTER — Emergency Department (HOSPITAL_BASED_OUTPATIENT_CLINIC_OR_DEPARTMENT_OTHER)
Admission: EM | Admit: 2022-07-03 | Discharge: 2022-07-03 | Disposition: A | Discharge status: Home / Self Care | Payer: No Typology Code available for payment source | Attending: Emergency Medicine | Admitting: Emergency Medicine

## 2022-07-03 DIAGNOSIS — L02214 Cutaneous abscess of groin: Secondary | ICD-10-CM

## 2022-07-03 MED ORDER — LIDOCAINE-EPINEPHRINE (PF) 2 %-1:200000 IJ SOLN
10.0000 mL | Freq: Once | INTRAMUSCULAR | Status: AC
  Administered 2022-07-03: 10 mL via INTRADERMAL
  Filled 2022-07-03: qty 20

## 2022-07-03 NOTE — ED Provider Notes (Signed)
[facility] Provider Note: [name], MD, FACEP  CSN: [id number] MRN: [id number] ARRIVAL: 07/02/22 at 2205 ROOM: [place]  07/03/22 12:54 AM Kenneth Schaefer Kenneth Schaefer is a 42 y.o. male with a tender, swollen, indurated area of his left groin fold for the past 5 days.  It has gradually worsened.  He rates associated pain as an 8 out of 10, worse with movement or standing.  It has been draining foul-smelling material.   Past Medical History:  Diagnosis Date   Medical history non-contributory     No past surgical history on file.  Family History  Problem Relation Age of Onset   Obesity Mother     Social History   Tobacco Use   Smoking status: Former    Types: Cigarettes   Smokeless tobacco: Never  Vaping Use   Vaping Use: Never used  Substance Use Topics   Alcohol use: Yes   Drug use: Yes    Types: Marijuana    Prior to Admission medications   Medication Sig Start Date End Date Taking? Authorizing Provider  Multiple Vitamin (MULTIVITAMIN) tablet Take 1 tablet by mouth daily.    [provider]    Allergies Percocet [oxycodone-acetaminophen]   REVIEW OF SYSTEMS  Negative except as noted here or in the History of Present Illness.   PHYSICAL EXAMINATION  Initial Vital Signs Blood pressure 126/88, pulse 71, temperature 98.2 F (36.8 C), temperature source Oral, resp. rate 20, height 5\' 11"  (1.803 m), weight 97.5 kg, SpO2 100 %.  Examination General: Well-developed, well-nourished male in no acute distress; appearance consistent with age of record HENT: normocephalic; atraumatic Eyes: Normal appearance Neck: supple Heart: regular rate and rhythm Lungs: clear to auscultation bilaterally Abdomen: soft; nondistended; nontender; bowel sounds present GU: Tanner V male, circumcised; draining abscess of left groin fold Extremities: No deformity; full range of motion; pulses  normal Neurologic: Awake, alert and oriented; motor function intact in all extremities and symmetric; no facial droop Skin: Warm and dry Psychiatric: Normal mood and affect   RESULTS  Summary of this visit's results, reviewed and interpreted by myself:   EKG Interpretation  Date/Time:    Ventricular Rate:    PR Interval:    QRS Duration:   QT Interval:    QTC Calculation:   R Axis:     Text Interpretation:         Laboratory Studies: No results found for this or any previous visit (from the past 24 hour(s)). Imaging Studies: No results found.  ED COURSE and MDM  Nursing notes, initial and subsequent vitals signs, including pulse oximetry, reviewed and interpreted by myself.  Vitals:   07/02/22 2236 07/02/22 2239  BP: 126/88   Pulse: 71   Resp: 20   Temp: 98.2 F (36.8 C)   TempSrc: Oral   SpO2: 100%   Weight:  97.5 kg  Height:  5\' 11"  (1.803 m)   Medications  lidocaine-EPINEPHrine (XYLOCAINE W/EPI) 2 %-1:200000 (PF) injection 10 mL (has no administration in time range)    I&D performed on abscess.  Patient advised to remove the packing in 3 days or return to the ED if symptoms are worsening.  PROCEDURES  Procedures INCISION AND DRAINAGE Performed by: Karen Chafe Taha Dimond Consent: Verbal consent obtained. Risks and benefits: risks, benefits and alternatives were discussed Type: abscess  Body area: Left groin  Anesthesia: local infiltration  Incision was made with a scalpel.  Local anesthetic: lidocaine 2% with epinephrine  Anesthetic total: 3 ml  Complexity: complex Blunt dissection to break up loculations  Drainage: purulent  Drainage amount: Copious  Packing material: 1/4 in iodoform gauze  Patient tolerance: Patient tolerated the procedure well with no immediate complications.    ED DIAGNOSES     ICD-10-CM   1. Abscess of left groin  L02.214          Shanon Rosser, MD 07/03/22 [phone]

## 2022-07-04 ENCOUNTER — Emergency Department (HOSPITAL_BASED_OUTPATIENT_CLINIC_OR_DEPARTMENT_OTHER)
Admission: EM | Admit: 2022-07-04 | Discharge: 2022-07-04 | Disposition: A | Discharge status: Home / Self Care | Payer: No Typology Code available for payment source | Attending: Emergency Medicine | Admitting: Emergency Medicine

## 2022-07-04 ENCOUNTER — Other Ambulatory Visit: Payer: Self-pay

## 2022-07-04 ENCOUNTER — Encounter (HOSPITAL_BASED_OUTPATIENT_CLINIC_OR_DEPARTMENT_OTHER): Payer: Self-pay

## 2022-07-04 DIAGNOSIS — L02214 Cutaneous abscess of groin: Secondary | ICD-10-CM | POA: Diagnosis not present

## 2022-07-04 NOTE — ED Provider Notes (Signed)
[facility] Provider Note   CSN: [id number] Arrival date & time: 07/04/22  1611     History  Chief Complaint  Patient presents with   Letter for School/Work    Kenneth Schaefer is a 42 y.o. male presents to the ED with need of an extended work note.  Patient was seen 2 days ago and had an abscess drained in his left groin area.  He took the packing out of it today and showered, stating it feels better and is no longer actively draining.  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        Home Medications Prior to Admission medications   Medication Sig Start Date End Date Taking? Authorizing Provider  Multiple Vitamin (MULTIVITAMIN) tablet Take 1 tablet by mouth daily.    [provider]      Allergies    Percocet [oxycodone-acetaminophen]    Review of Systems   Review of Systems  Constitutional:  Negative for fever.  Skin:        Abscess to left groin that was drained and has improved    Physical Exam Updated Vital Signs BP 132/83 (BP Location: Left Arm)   Pulse 83   Temp 98.7 F (37.1 C) (Oral)   Resp 17   Ht 5' 11"$  (1.803 m)   Wt 97.5 kg   SpO2 99%   BMI 29.99 kg/m  Physical Exam Vitals and nursing note reviewed.  Constitutional:      General: He is not in acute distress.    Appearance: Normal appearance. He is not ill-appearing or diaphoretic.  Cardiovascular:     Rate and Rhythm: Normal rate and regular rhythm.  Pulmonary:     Effort: Pulmonary effort is normal.  Genitourinary:    Comments: GU exam deferred by patient.  He reports packing was taken out by him earlier today and the swelling has improved significantly with little to no active drainage at this time  Neurological:     Mental Status: He is alert. Mental status is at baseline.  Psychiatric:        Mood and  Affect: Mood normal.        Behavior: Behavior normal.     ED Results / Procedures / Treatments   Labs (all labs ordered are listed, but only abnormal results are displayed) Labs Reviewed - No data to display  EKG None  Radiology No results found.  Procedures Procedures    Medications Ordered in ED Medications - No data to display  ED Course/ Medical Decision Making/ A&P                             Medical Decision Making  Patient presents to the ED in need of a work note extension.  Patient had a large abscess in his left groin that was drained 2 days prior and packed.  He states he remove the packing material today and cleanse the area with Dial bacterial soap during his shower.  He states that the swelling has significantly improved as well as the pain.  He reports little to no active drainage at this time.  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  He has no new concerns regarding the abscess site.  Patient does have extended time off starting it tomorrow and only needs work note for today.  This was provided for him given the nature of his job.  Discussed with patient continued use of antibacterial soap and to continue to monitor the area for any changes or further fluid buildup.          Final Clinical Impression(s) / ED Diagnoses Final diagnoses:  Abscess of groin    Rx / DC Orders ED Discharge Orders     None         Pat Kocher, PA 07/04/22 1713    Elgie Congo, MD 07/05/22 [phone]

## 2022-07-04 NOTE — ED Triage Notes (Signed)
States he had an abscess drained on his groin 2 days ago. Here to get extension on work note. Removed packing today.

## 2022-07-04 NOTE — Discharge Instructions (Addendum)
Thank you for allowing me to be part of your care today.  I have provided you a work note to extend your time off work.  Continue to monitor the area for changes such as build up of fluid or other signs of infection.

## 2024-02-19 ENCOUNTER — Other Ambulatory Visit: Payer: Self-pay

## 2024-02-19 ENCOUNTER — Encounter (HOSPITAL_BASED_OUTPATIENT_CLINIC_OR_DEPARTMENT_OTHER): Payer: Self-pay

## 2024-02-19 ENCOUNTER — Emergency Department (HOSPITAL_BASED_OUTPATIENT_CLINIC_OR_DEPARTMENT_OTHER)

## 2024-02-19 ENCOUNTER — Emergency Department (HOSPITAL_BASED_OUTPATIENT_CLINIC_OR_DEPARTMENT_OTHER): Admission: EM | Admit: 2024-02-19 | Discharge: 2024-02-19 | Disposition: A | Discharge status: Home / Self Care

## 2024-02-19 DIAGNOSIS — M25462 Effusion, left knee: Secondary | ICD-10-CM | POA: Insufficient documentation

## 2024-02-19 DIAGNOSIS — M25562 Pain in left knee: Secondary | ICD-10-CM

## 2024-02-19 DIAGNOSIS — R519 Headache, unspecified: Secondary | ICD-10-CM | POA: Insufficient documentation

## 2024-02-19 DIAGNOSIS — Y92410 Unspecified street and highway as the place of occurrence of the external cause: Secondary | ICD-10-CM | POA: Insufficient documentation

## 2024-02-19 DIAGNOSIS — M542 Cervicalgia: Secondary | ICD-10-CM | POA: Diagnosis not present

## 2024-02-19 MED ORDER — LIDOCAINE 5 % EX PTCH: 1.0000 | MEDICATED_PATCH | CUTANEOUS | 0 refills | Status: AC

## 2024-02-19 MED ORDER — METHOCARBAMOL 500 MG PO TABS: 500.0000 mg | ORAL_TABLET | Freq: Two times a day (BID) | ORAL | 0 refills | Status: AC

## 2024-02-19 MED ORDER — IBUPROFEN 800 MG PO TABS: 800.0000 mg | ORAL_TABLET | Freq: Three times a day (TID) | ORAL | 0 refills | Status: AC

## 2024-02-19 NOTE — ED Notes (Signed)
D/c paperwork reviewed with pt, including prescriptions and follow up care.  No questions or concerns voiced at time of d/c. . Pt verbalized understanding, Ambulatory without assistance to ED exit, NAD.   
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
## 2024-02-19 NOTE — Discharge Instructions (Addendum)
 It was a pleasure taking care of you today.  Based on your history, physical exam, as well as imaging I feel you are safe for discharge.  Today your exam and x-ray of your left knee was reassuring.  Through shared decision making we decided not to CT your head or your neck today and continue to monitor symptoms.  I have attached the orthopedic follow-up information, please call as soon as possible and make a follow-up appointment.  Recommend follow-up within 1 to 2 weeks if symptoms persist.  For your symptoms you have been prescribed a prescription strength anti-inflammatory medication, please do not take ibuprofen  with this medication as they are in the same class.  You have also been given a prescription for lidocaine  patches, as well as a muscle relaxant.  The muscle relaxant may make you drowsy so please do not drive or operate heavy machinery after taking it.  Please wear your left knee sleeve for comfort.  If you experience any of the following symptoms including but not limited to severe head pain, visual disturbances, numbness/tingling, unexplained weakness, issues with balance or coordination, severe left knee pain, inability to walk, severe neck pain, or other concerning symptom please return to the emergency department.  If symptoms persist or worsen recommend follow-up with your primary care provider within 48 hours.  You may also take Tylenol , please do not exceed the max daily limit of 4000 mg/day, or 1000 mg in the single dose.  Recommend RICE therapy for help with your symptoms including rest, ice, compression, as well as elevation.
